# Patient Record
Sex: Male | Born: 1935 | Race: Black or African American | Hispanic: No | Marital: Married | Smoking: Former smoker
Health system: Southern US, Community
[De-identification: ages and names within clinical notes are randomized; demographics above are authoritative.]

## PROBLEM LIST (undated history)

## (undated) DIAGNOSIS — I10 Essential (primary) hypertension: Secondary | ICD-10-CM

## (undated) DIAGNOSIS — M1990 Unspecified osteoarthritis, unspecified site: Secondary | ICD-10-CM

## (undated) HISTORY — PX: COLONOSCOPY: SHX174

---

## 1999-09-02 ENCOUNTER — Ambulatory Visit (HOSPITAL_COMMUNITY): Admission: RE | Admit: 1999-09-02 | Discharge: 1999-09-02 | Payer: Self-pay | Admitting: Gastroenterology

## 1999-09-02 ENCOUNTER — Encounter (INDEPENDENT_AMBULATORY_CARE_PROVIDER_SITE_OTHER): Payer: Self-pay

## 2002-12-03 ENCOUNTER — Encounter (INDEPENDENT_AMBULATORY_CARE_PROVIDER_SITE_OTHER): Payer: Self-pay

## 2002-12-03 ENCOUNTER — Ambulatory Visit (HOSPITAL_COMMUNITY): Admission: RE | Admit: 2002-12-03 | Discharge: 2002-12-03 | Payer: Self-pay | Admitting: Gastroenterology

## 2003-04-05 ENCOUNTER — Encounter: Payer: Self-pay | Admitting: Internal Medicine

## 2003-04-05 ENCOUNTER — Ambulatory Visit (HOSPITAL_COMMUNITY): Admission: RE | Admit: 2003-04-05 | Discharge: 2003-04-05 | Payer: Self-pay | Admitting: Internal Medicine

## 2008-07-24 ENCOUNTER — Ambulatory Visit (HOSPITAL_COMMUNITY): Admission: RE | Admit: 2008-07-24 | Discharge: 2008-07-24 | Payer: Self-pay | Admitting: Internal Medicine

## 2011-02-19 NOTE — Op Note (Signed)
   [name], Derek Stephens                         ACCOUNT NO.:  [id number]   MEDICAL RECORD NO.:  [id number]                   PATIENT TYPE:  AMB   LOCATION:  ENDO                                 FACILITY:  [facility]   PHYSICIAN:  John C. Madilyn Fireman, M.D.                 DATE OF BIRTH:  1929-12-28   DATE OF PROCEDURE:  12/03/2002  DATE OF DISCHARGE:                                 OPERATIVE REPORT   PROCEDURE:  Colonoscopy with polypectomy.   INDICATION FOR PROCEDURE:  History of colon polyp on initial colonoscopy  three and a half years ago.   DESCRIPTION OF PROCEDURE:  The patient was placed in the left lateral  decubitus position and placed on the pulse monitor with continuous low-flow  oxygen delivered by nasal cannula.  He was sedated with 75 mcg of IV  fentanyl and 7 mg of IV Versed.  The Olympus video colonoscope was inserted  into the rectum and advanced to the cecum, confirmed by transillumination at  McBurney's point and visualization of the ileocecal valve and appendiceal  orifice.  The prep was excellent.  The cecum, ascending, transverse, and  descending colon all appeared normal with no masses, polyps, diverticula, or  other mucosal abnormalities.  Within the sigmoid colon, there was seen a 1-  cm polyp which was removed by snare.  The remainder of the sigmoid and  rectum appeared normal.  The scope was then withdrawn and the patient  returned to the recovery room in stable condition.  He tolerated the  procedure well and there were no immediate complications.   IMPRESSION:  A 1-cm sigmoid colon polyp.   PLAN:  Await histology and we will probably repeat colonoscopy in three to  five years.                                                John C. Madilyn Fireman, M.D.    JCH/MEDQ  D:  12/03/2002  T:  12/03/2002  Job:  [id number]   cc:   Margaretmary Bayley, M.D.  [address]  [place]  [place] [address]  Fax: [phone]

## 2011-09-09 ENCOUNTER — Ambulatory Visit (HOSPITAL_COMMUNITY)
Admission: RE | Admit: 2011-09-09 | Discharge: 2011-09-09 | Disposition: A | Discharge status: Home / Self Care | Payer: Medicare Other | Source: Ambulatory Visit | Attending: Internal Medicine | Admitting: Internal Medicine

## 2011-09-09 ENCOUNTER — Other Ambulatory Visit: Payer: Self-pay | Admitting: Internal Medicine

## 2011-09-09 DIAGNOSIS — R52 Pain, unspecified: Secondary | ICD-10-CM

## 2011-09-09 DIAGNOSIS — W11XXXA Fall on and from ladder, initial encounter: Secondary | ICD-10-CM

## 2011-09-09 DIAGNOSIS — M25559 Pain in unspecified hip: Secondary | ICD-10-CM | POA: Insufficient documentation

## 2011-09-09 DIAGNOSIS — M546 Pain in thoracic spine: Secondary | ICD-10-CM | POA: Insufficient documentation

## 2011-09-17 ENCOUNTER — Other Ambulatory Visit: Payer: Self-pay | Admitting: Internal Medicine

## 2011-09-17 DIAGNOSIS — M549 Dorsalgia, unspecified: Secondary | ICD-10-CM

## 2011-09-18 ENCOUNTER — Ambulatory Visit (HOSPITAL_COMMUNITY)
Admission: RE | Admit: 2011-09-18 | Discharge: 2011-09-18 | Disposition: A | Discharge status: Home / Self Care | Payer: MEDICARE | Source: Ambulatory Visit | Attending: Internal Medicine | Admitting: Internal Medicine

## 2011-09-18 DIAGNOSIS — M549 Dorsalgia, unspecified: Secondary | ICD-10-CM

## 2011-09-18 DIAGNOSIS — M48061 Spinal stenosis, lumbar region without neurogenic claudication: Secondary | ICD-10-CM | POA: Insufficient documentation

## 2011-09-18 DIAGNOSIS — M545 Low back pain, unspecified: Secondary | ICD-10-CM | POA: Insufficient documentation

## 2011-09-18 DIAGNOSIS — M79609 Pain in unspecified limb: Secondary | ICD-10-CM | POA: Insufficient documentation

## 2012-03-01 ENCOUNTER — Other Ambulatory Visit: Payer: Self-pay | Admitting: Neurosurgery

## 2012-03-10 ENCOUNTER — Encounter (HOSPITAL_COMMUNITY): Payer: Self-pay | Admitting: Respiratory Therapy

## 2012-03-16 ENCOUNTER — Encounter (HOSPITAL_COMMUNITY): Payer: Self-pay

## 2012-03-16 ENCOUNTER — Ambulatory Visit (HOSPITAL_COMMUNITY)
Admission: RE | Admit: 2012-03-16 | Discharge: 2012-03-16 | Disposition: A | Discharge status: Home / Self Care | Payer: Medicare Other | Source: Ambulatory Visit | Attending: Neurosurgery | Admitting: Neurosurgery

## 2012-03-16 ENCOUNTER — Encounter (HOSPITAL_COMMUNITY)
Admission: RE | Admit: 2012-03-16 | Discharge: 2012-03-16 | Disposition: A | Discharge status: Home / Self Care | Payer: Medicare Other | Source: Ambulatory Visit | Attending: Neurosurgery | Admitting: Neurosurgery

## 2012-03-16 DIAGNOSIS — M51379 Other intervertebral disc degeneration, lumbosacral region without mention of lumbar back pain or lower extremity pain: Secondary | ICD-10-CM | POA: Insufficient documentation

## 2012-03-16 DIAGNOSIS — M47817 Spondylosis without myelopathy or radiculopathy, lumbosacral region: Secondary | ICD-10-CM | POA: Insufficient documentation

## 2012-03-16 DIAGNOSIS — M5137 Other intervertebral disc degeneration, lumbosacral region: Secondary | ICD-10-CM | POA: Insufficient documentation

## 2012-03-16 DIAGNOSIS — M8448XA Pathological fracture, other site, initial encounter for fracture: Secondary | ICD-10-CM | POA: Insufficient documentation

## 2012-03-16 DIAGNOSIS — Z01812 Encounter for preprocedural laboratory examination: Secondary | ICD-10-CM | POA: Insufficient documentation

## 2012-03-16 DIAGNOSIS — Z01818 Encounter for other preprocedural examination: Secondary | ICD-10-CM | POA: Insufficient documentation

## 2012-03-16 HISTORY — DX: Unspecified osteoarthritis, unspecified site: M19.90

## 2012-03-16 HISTORY — DX: Essential (primary) hypertension: I10

## 2012-03-16 LAB — CBC
HCT: 41.1 % (ref 39.0–52.0)
Hemoglobin: 13.9 g/dL (ref 13.0–17.0)
MCH: 29.7 pg (ref 26.0–34.0)
MCHC: 33.8 g/dL (ref 30.0–36.0)
MCV: 87.8 fL (ref 78.0–100.0)
Platelets: 228 10*3/uL (ref 150–400)
RBC: 4.68 MIL/uL (ref 4.22–5.81)
RDW: 15.4 % (ref 11.5–15.5)
WBC: 4.7 10*3/uL (ref 4.0–10.5)

## 2012-03-16 LAB — BASIC METABOLIC PANEL
BUN: 18 mg/dL (ref 6–23)
CO2: 23 mEq/L (ref 19–32)
Calcium: 9.6 mg/dL (ref 8.4–10.5)
Chloride: 106 mEq/L (ref 96–112)
Creatinine, Ser: 1.42 mg/dL — ABNORMAL HIGH (ref 0.50–1.35)
GFR calc Af Amer: 53 mL/min — ABNORMAL LOW (ref >90)
GFR calc non Af Amer: 45 mL/min — ABNORMAL LOW (ref >90)
Glucose, Bld: 96 mg/dL (ref 70–99)
Potassium: 3.9 mEq/L (ref 3.5–5.1)
Sodium: 140 mEq/L (ref 135–145)

## 2012-03-16 LAB — SURGICAL PCR SCREEN
MRSA, PCR: NEGATIVE
Staphylococcus aureus: POSITIVE — AB

## 2012-03-16 NOTE — Pre-Procedure Instructions (Addendum)
20 Cid T Wierzba  03/16/2012   Your procedure is scheduled on:  03/23/2012  Report to [facility] at 8:30 AM.  Call this number if you have problems the morning of surgery: [phone]   Remember:   Do not eat food or drink After Midnight.  Wednesday        Take these medicines the morning of surgery with A SIP OF WATER:allopurinol   Do not wear jewelry, make-up or nail polish.  Do not wear lotions, powders, or perfumes. You may wear deodorant.  Do not shave 48 hours prior to surgery. Men may shave face and neck.  Do not bring valuables to the hospital.  Contacts, dentures or bridgework may not be worn into surgery.  Leave suitcase in the car. After surgery it may be brought to your room.  For patients admitted to the hospital, checkout time is 11:00 AM the day of discharge.   Patients discharged the day of surgery will not be allowed to drive home.  Name and phone number of your driver: /w family  Special Instructions: CHG Shower Use Special Wash: 1/2 bottle night before surgery and 1/2 bottle morning of surgery.   Please read over the following fact sheets that you were given: Pain Booklet, Coughing and Deep Breathing, MRSA Information and Surgical Site Infection Prevention

## 2012-03-16 NOTE — Progress Notes (Signed)
Call to Dr. Lajoyce Corners office, to request EKG, it will be faxed to [phone]

## 2012-03-22 MED ORDER — CEFAZOLIN SODIUM-DEXTROSE 2-3 GM-% IV SOLR
2.0000 g | INTRAVENOUS | Status: AC
  Administered 2012-03-23: 2 g via INTRAVENOUS
  Filled 2012-03-22: qty 50

## 2012-03-23 ENCOUNTER — Encounter (HOSPITAL_COMMUNITY): Payer: Self-pay | Admitting: *Deleted

## 2012-03-23 ENCOUNTER — Ambulatory Visit (HOSPITAL_COMMUNITY): Payer: Medicare Other | Admitting: Anesthesiology

## 2012-03-23 ENCOUNTER — Encounter (HOSPITAL_COMMUNITY): Payer: Self-pay | Admitting: Anesthesiology

## 2012-03-23 ENCOUNTER — Inpatient Hospital Stay (HOSPITAL_COMMUNITY)
Admission: RE | Admit: 2012-03-23 | Discharge: 2012-03-24 | DRG: 491 | Disposition: A | Discharge status: Home / Self Care | Payer: Medicare Other | Source: Ambulatory Visit | Attending: Neurosurgery | Admitting: Neurosurgery

## 2012-03-23 ENCOUNTER — Ambulatory Visit (HOSPITAL_COMMUNITY): Payer: Medicare Other

## 2012-03-23 ENCOUNTER — Encounter (HOSPITAL_COMMUNITY)
Admission: RE | Disposition: A | Discharge status: Home / Self Care | Payer: Self-pay | Source: Ambulatory Visit | Attending: Neurosurgery

## 2012-03-23 DIAGNOSIS — M47817 Spondylosis without myelopathy or radiculopathy, lumbosacral region: Principal | ICD-10-CM | POA: Diagnosis present

## 2012-03-23 DIAGNOSIS — R339 Retention of urine, unspecified: Secondary | ICD-10-CM | POA: Diagnosis not present

## 2012-03-23 DIAGNOSIS — Z87891 Personal history of nicotine dependence: Secondary | ICD-10-CM

## 2012-03-23 DIAGNOSIS — M51379 Other intervertebral disc degeneration, lumbosacral region without mention of lumbar back pain or lower extremity pain: Secondary | ICD-10-CM | POA: Diagnosis present

## 2012-03-23 DIAGNOSIS — M5137 Other intervertebral disc degeneration, lumbosacral region: Secondary | ICD-10-CM | POA: Diagnosis present

## 2012-03-23 DIAGNOSIS — Z79899 Other long term (current) drug therapy: Secondary | ICD-10-CM

## 2012-03-23 DIAGNOSIS — M48062 Spinal stenosis, lumbar region with neurogenic claudication: Secondary | ICD-10-CM | POA: Diagnosis present

## 2012-03-23 DIAGNOSIS — I10 Essential (primary) hypertension: Secondary | ICD-10-CM | POA: Diagnosis present

## 2012-03-23 HISTORY — PX: LUMBAR LAMINECTOMY/DECOMPRESSION MICRODISCECTOMY: SHX5026

## 2012-03-23 SURGERY — LUMBAR LAMINECTOMY/DECOMPRESSION MICRODISCECTOMY 2 LEVELS
Anesthesia: General | Site: Spine Lumbar

## 2012-03-23 MED ORDER — ALUM & MAG HYDROXIDE-SIMETH 200-200-20 MG/5ML PO SUSP: 30.0000 mL | Freq: Four times a day (QID) | ORAL | Status: DC | PRN

## 2012-03-23 MED ORDER — GLYCOPYRROLATE 0.2 MG/ML IJ SOLN
INTRAMUSCULAR | Status: DC | PRN
  Administered 2012-03-23: 0.6 mg via INTRAVENOUS

## 2012-03-23 MED ORDER — SODIUM CHLORIDE 0.9 % IJ SOLN
3.0000 mL | Freq: Two times a day (BID) | INTRAMUSCULAR | Status: DC
  Administered 2012-03-23 – 2012-03-24 (×2): 3 mL via INTRAVENOUS

## 2012-03-23 MED ORDER — ACETAMINOPHEN 325 MG PO TABS: 650.0000 mg | ORAL_TABLET | ORAL | Status: DC | PRN

## 2012-03-23 MED ORDER — MAGNESIUM HYDROXIDE 400 MG/5ML PO SUSP: 30.0000 mL | Freq: Every day | ORAL | Status: DC | PRN

## 2012-03-23 MED ORDER — NEOSTIGMINE METHYLSULFATE 1 MG/ML IJ SOLN
INTRAMUSCULAR | Status: DC | PRN
  Administered 2012-03-23: 4 mg via INTRAVENOUS

## 2012-03-23 MED ORDER — HETASTARCH-ELECTROLYTES 6 % IV SOLN
INTRAVENOUS | Status: DC | PRN
  Administered 2012-03-23: 11:00:00 via INTRAVENOUS

## 2012-03-23 MED ORDER — SODIUM CHLORIDE 0.9 % IJ SOLN: 3.0000 mL | INTRAMUSCULAR | Status: DC | PRN

## 2012-03-23 MED ORDER — LIDOCAINE-EPINEPHRINE 1 %-1:100000 IJ SOLN
INTRAMUSCULAR | Status: DC | PRN
  Administered 2012-03-23: 20 mL

## 2012-03-23 MED ORDER — AMLODIPINE-OLMESARTAN 10-40 MG PO TABS: 1.0000 | ORAL_TABLET | Freq: Every day | ORAL | Status: DC

## 2012-03-23 MED ORDER — BUPIVACAINE HCL (PF) 0.5 % IJ SOLN
INTRAMUSCULAR | Status: DC | PRN
  Administered 2012-03-23: 20 mL

## 2012-03-23 MED ORDER — LIDOCAINE HCL 1 % IJ SOLN
INTRAMUSCULAR | Status: DC | PRN
  Administered 2012-03-23: 40 mg via INTRADERMAL

## 2012-03-23 MED ORDER — MORPHINE SULFATE 4 MG/ML IJ SOLN: 4.0000 mg | INTRAMUSCULAR | Status: DC | PRN

## 2012-03-23 MED ORDER — SODIUM CHLORIDE 0.9 % IV SOLN
INTRAVENOUS | Status: AC
  Filled 2012-03-23: qty 500

## 2012-03-23 MED ORDER — PHENYLEPHRINE HCL 10 MG/ML IJ SOLN
INTRAMUSCULAR | Status: DC | PRN
  Administered 2012-03-23: 80 ug via INTRAVENOUS
  Administered 2012-03-23: 40 ug via INTRAVENOUS
  Administered 2012-03-23 (×2): 80 ug via INTRAVENOUS
  Administered 2012-03-23: 40 ug via INTRAVENOUS
  Administered 2012-03-23: 80 ug via INTRAVENOUS

## 2012-03-23 MED ORDER — OXYCODONE-ACETAMINOPHEN 5-325 MG PO TABS: 1.0000 | ORAL_TABLET | ORAL | Status: DC | PRN

## 2012-03-23 MED ORDER — LIDOCAINE HCL 4 % MT SOLN
OROMUCOSAL | Status: DC | PRN
  Administered 2012-03-23: 4 mL via TOPICAL

## 2012-03-23 MED ORDER — KETOROLAC TROMETHAMINE 30 MG/ML IJ SOLN
15.0000 mg | Freq: Four times a day (QID) | INTRAMUSCULAR | Status: DC
  Administered 2012-03-23 – 2012-03-24 (×4): 15 mg via INTRAVENOUS
  Filled 2012-03-23 (×7): qty 1

## 2012-03-23 MED ORDER — ACETAMINOPHEN 10 MG/ML IV SOLN
1000.0000 mg | Freq: Four times a day (QID) | INTRAVENOUS | Status: AC
  Administered 2012-03-23 – 2012-03-24 (×4): 1000 mg via INTRAVENOUS
  Filled 2012-03-23 (×4): qty 100

## 2012-03-23 MED ORDER — EPHEDRINE SULFATE 50 MG/ML IJ SOLN
INTRAMUSCULAR | Status: DC | PRN
  Administered 2012-03-23 (×3): 10 mg via INTRAVENOUS

## 2012-03-23 MED ORDER — THROMBIN 5000 UNITS EX KIT
PACK | CUTANEOUS | Status: DC | PRN
  Administered 2012-03-23 (×4): 5000 [IU] via TOPICAL

## 2012-03-23 MED ORDER — HEMOSTATIC AGENTS (NO CHARGE) OPTIME
TOPICAL | Status: DC | PRN
  Administered 2012-03-23 (×2): 1 via TOPICAL

## 2012-03-23 MED ORDER — FENTANYL CITRATE 0.05 MG/ML IJ SOLN
INTRAMUSCULAR | Status: DC | PRN
  Administered 2012-03-23 (×2): 100 ug via INTRAVENOUS

## 2012-03-23 MED ORDER — ALLOPURINOL 100 MG PO TABS
100.0000 mg | ORAL_TABLET | Freq: Every day | ORAL | Status: DC
  Administered 2012-03-24: 100 mg via ORAL
  Filled 2012-03-23 (×2): qty 1

## 2012-03-23 MED ORDER — 0.9 % SODIUM CHLORIDE (POUR BTL) OPTIME
TOPICAL | Status: DC | PRN
  Administered 2012-03-23: 1000 mL

## 2012-03-23 MED ORDER — ZOLPIDEM TARTRATE 5 MG PO TABS: 5.0000 mg | ORAL_TABLET | Freq: Every evening | ORAL | Status: DC | PRN

## 2012-03-23 MED ORDER — KETOROLAC TROMETHAMINE 30 MG/ML IJ SOLN: 15.0000 mg | Freq: Once | INTRAMUSCULAR | Status: DC

## 2012-03-23 MED ORDER — LACTATED RINGERS IV SOLN
INTRAVENOUS | Status: DC | PRN
  Administered 2012-03-23 (×2): via INTRAVENOUS

## 2012-03-23 MED ORDER — ACETAMINOPHEN 650 MG RE SUPP: 650.0000 mg | RECTAL | Status: DC | PRN

## 2012-03-23 MED ORDER — ONDANSETRON HCL 4 MG/2ML IJ SOLN: 4.0000 mg | Freq: Once | INTRAMUSCULAR | Status: DC | PRN

## 2012-03-23 MED ORDER — ACETAMINOPHEN 10 MG/ML IV SOLN
INTRAVENOUS | Status: AC
  Administered 2012-03-23: 1000 mg via INTRAVENOUS
  Filled 2012-03-23: qty 100

## 2012-03-23 MED ORDER — PROPOFOL 10 MG/ML IV EMUL
INTRAVENOUS | Status: DC | PRN
  Administered 2012-03-23: 150 mg via INTRAVENOUS

## 2012-03-23 MED ORDER — KCL IN DEXTROSE-NACL 20-5-0.45 MEQ/L-%-% IV SOLN
INTRAVENOUS | Status: DC
  Filled 2012-03-23 (×5): qty 1000

## 2012-03-23 MED ORDER — BISACODYL 10 MG RE SUPP: 10.0000 mg | Freq: Every day | RECTAL | Status: DC | PRN

## 2012-03-23 MED ORDER — MIDAZOLAM HCL 5 MG/5ML IJ SOLN
INTRAMUSCULAR | Status: DC | PRN
  Administered 2012-03-23: 0.5 mg via INTRAVENOUS

## 2012-03-23 MED ORDER — HYDROCODONE-ACETAMINOPHEN 5-325 MG PO TABS: 1.0000 | ORAL_TABLET | ORAL | Status: DC | PRN

## 2012-03-23 MED ORDER — ONDANSETRON HCL 4 MG/2ML IJ SOLN
INTRAMUSCULAR | Status: DC | PRN
  Administered 2012-03-23: 4 mg via INTRAVENOUS

## 2012-03-23 MED ORDER — ROCURONIUM BROMIDE 100 MG/10ML IV SOLN
INTRAVENOUS | Status: DC | PRN
  Administered 2012-03-23: 50 mg via INTRAVENOUS

## 2012-03-23 MED ORDER — CYCLOBENZAPRINE HCL 10 MG PO TABS: 10.0000 mg | ORAL_TABLET | Freq: Three times a day (TID) | ORAL | Status: DC | PRN

## 2012-03-23 MED ORDER — AMLODIPINE BESYLATE 10 MG PO TABS
10.0000 mg | ORAL_TABLET | Freq: Every day | ORAL | Status: DC
  Administered 2012-03-24: 10 mg via ORAL
  Filled 2012-03-23: qty 1

## 2012-03-23 MED ORDER — PHENOL 1.4 % MT LIQD: 1.0000 | OROMUCOSAL | Status: DC | PRN

## 2012-03-23 MED ORDER — ARTIFICIAL TEARS OP OINT
TOPICAL_OINTMENT | OPHTHALMIC | Status: DC | PRN
  Administered 2012-03-23: 1 via OPHTHALMIC

## 2012-03-23 MED ORDER — MENTHOL 3 MG MT LOZG: 1.0000 | LOZENGE | OROMUCOSAL | Status: DC | PRN

## 2012-03-23 MED ORDER — HYDROXYZINE HCL 50 MG/ML IM SOLN: 50.0000 mg | INTRAMUSCULAR | Status: DC | PRN

## 2012-03-23 MED ORDER — HYDROMORPHONE HCL PF 1 MG/ML IJ SOLN: 0.2500 mg | INTRAMUSCULAR | Status: DC | PRN

## 2012-03-23 MED ORDER — BACITRACIN 50000 UNITS IM SOLR
INTRAMUSCULAR | Status: AC
  Filled 2012-03-23: qty 1

## 2012-03-23 MED ORDER — HYDROXYZINE HCL 25 MG PO TABS: 50.0000 mg | ORAL_TABLET | ORAL | Status: DC | PRN

## 2012-03-23 MED ORDER — SODIUM CHLORIDE 0.9 % IR SOLN
Status: DC | PRN
  Administered 2012-03-23: 11:00:00

## 2012-03-23 MED ORDER — IRBESARTAN 300 MG PO TABS
300.0000 mg | ORAL_TABLET | Freq: Every day | ORAL | Status: DC
  Administered 2012-03-24: 300 mg via ORAL
  Filled 2012-03-23: qty 1

## 2012-03-23 SURGICAL SUPPLY — 58 items
BAG DECANTER FOR FLEXI CONT (MISCELLANEOUS) ×2 IMPLANT
BENZOIN TINCTURE PRP APPL 2/3 (GAUZE/BANDAGES/DRESSINGS) IMPLANT
BLADE SURG ROTATE 9660 (MISCELLANEOUS) IMPLANT
BRUSH SCRUB EZ PLAIN DRY (MISCELLANEOUS) ×2 IMPLANT
BUR ACORN 6.0 ACORN (BURR) IMPLANT
BUR ACRON 5.0MM COATED (BURR) ×2 IMPLANT
BUR MATCHSTICK NEURO 3.0 LAGG (BURR) ×2 IMPLANT
CANISTER SUCTION 2500CC (MISCELLANEOUS) ×2 IMPLANT
CLOTH BEACON ORANGE TIMEOUT ST (SAFETY) ×2 IMPLANT
CONT SPEC 4OZ CLIKSEAL STRL BL (MISCELLANEOUS) IMPLANT
DERMABOND ADVANCED (GAUZE/BANDAGES/DRESSINGS) ×3
DERMABOND ADVANCED .7 DNX12 (GAUZE/BANDAGES/DRESSINGS) ×3 IMPLANT
DRAPE LAPAROTOMY 100X72X124 (DRAPES) ×2 IMPLANT
DRAPE MICROSCOPE LEICA (MISCELLANEOUS) IMPLANT
DRAPE POUCH INSTRU U-SHP 10X18 (DRAPES) ×2 IMPLANT
DRSG EMULSION OIL 3X3 NADH (GAUZE/BANDAGES/DRESSINGS) IMPLANT
ELECT REM PT RETURN 9FT ADLT (ELECTROSURGICAL) ×2
ELECTRODE REM PT RTRN 9FT ADLT (ELECTROSURGICAL) ×1 IMPLANT
GAUZE SPONGE 4X4 16PLY XRAY LF (GAUZE/BANDAGES/DRESSINGS) ×2 IMPLANT
GLOVE BIO SURGEON STRL SZ8 (GLOVE) ×2 IMPLANT
GLOVE BIOGEL PI IND STRL 8 (GLOVE) ×1 IMPLANT
GLOVE BIOGEL PI INDICATOR 8 (GLOVE) ×1
GLOVE ECLIPSE 7.5 STRL STRAW (GLOVE) ×2 IMPLANT
GLOVE EXAM NITRILE LRG STRL (GLOVE) IMPLANT
GLOVE EXAM NITRILE MD LF STRL (GLOVE) ×2 IMPLANT
GLOVE EXAM NITRILE XL STR (GLOVE) IMPLANT
GLOVE EXAM NITRILE XS STR PU (GLOVE) IMPLANT
GLOVE INDICATOR 7.0 STRL GRN (GLOVE) ×2 IMPLANT
GLOVE SS BIOGEL STRL SZ 8 (GLOVE) ×1 IMPLANT
GLOVE SUPERSENSE BIOGEL SZ 8 (GLOVE) ×1
GLOVE SURG SS PI 7.0 STRL IVOR (GLOVE) ×4 IMPLANT
GOWN BRE IMP SLV AUR LG STRL (GOWN DISPOSABLE) ×2 IMPLANT
GOWN BRE IMP SLV AUR XL STRL (GOWN DISPOSABLE) ×4 IMPLANT
GOWN STRL REIN 2XL LVL4 (GOWN DISPOSABLE) IMPLANT
KIT BASIN OR (CUSTOM PROCEDURE TRAY) ×2 IMPLANT
KIT ROOM TURNOVER OR (KITS) ×2 IMPLANT
NEEDLE HYPO 18GX1.5 BLUNT FILL (NEEDLE) IMPLANT
NEEDLE SPNL 18GX3.5 QUINCKE PK (NEEDLE) ×2 IMPLANT
NEEDLE SPNL 22GX3.5 QUINCKE BK (NEEDLE) ×4 IMPLANT
NS IRRIG 1000ML POUR BTL (IV SOLUTION) ×2 IMPLANT
PACK LAMINECTOMY NEURO (CUSTOM PROCEDURE TRAY) ×2 IMPLANT
PAD ARMBOARD 7.5X6 YLW CONV (MISCELLANEOUS) ×6 IMPLANT
PATTIES SURGICAL .5 X1 (DISPOSABLE) ×2 IMPLANT
RUBBERBAND STERILE (MISCELLANEOUS) IMPLANT
SPONGE GAUZE 4X4 12PLY (GAUZE/BANDAGES/DRESSINGS) IMPLANT
SPONGE LAP 4X18 X RAY DECT (DISPOSABLE) IMPLANT
SPONGE SURGIFOAM ABS GEL SZ50 (HEMOSTASIS) ×2 IMPLANT
STRIP CLOSURE SKIN 1/2X4 (GAUZE/BANDAGES/DRESSINGS) IMPLANT
SUT PROLENE 6 0 BV (SUTURE) IMPLANT
SUT VIC AB 1 CT1 18XBRD ANBCTR (SUTURE) ×2 IMPLANT
SUT VIC AB 1 CT1 8-18 (SUTURE) ×2
SUT VIC AB 2-0 CP2 18 (SUTURE) ×4 IMPLANT
SUT VIC AB 3-0 SH 8-18 (SUTURE) ×2 IMPLANT
SYR 20CC LL (SYRINGE) IMPLANT
SYR 5ML LL (SYRINGE) IMPLANT
TOWEL OR 17X24 6PK STRL BLUE (TOWEL DISPOSABLE) ×2 IMPLANT
TOWEL OR 17X26 10 PK STRL BLUE (TOWEL DISPOSABLE) ×2 IMPLANT
WATER STERILE IRR 1000ML POUR (IV SOLUTION) ×2 IMPLANT

## 2012-03-23 NOTE — Transfer of Care (Signed)
Immediate Anesthesia Transfer of Care Note  Patient: Derek Stephens  Procedure(s) Performed: Procedure(s) (LRB): LUMBAR LAMINECTOMY/DECOMPRESSION MICRODISCECTOMY 2 LEVELS (N/A)  Patient Location: PACU  Anesthesia Type: General  Level of Consciousness: awake, alert , oriented and patient cooperative  Airway & Oxygen Therapy: Patient Spontanous Breathing and Patient connected to nasal cannula oxygen  Post-op Assessment: Report given to PACU RN, Post -op Vital signs reviewed and stable and Patient moving all extremities X 4  Post vital signs: Reviewed and stable  Complications: No apparent anesthesia complications

## 2012-03-23 NOTE — H&P (Signed)
Subjective: Patient is a 78 y.o. male who is admitted for treatment of multilevel, multifactorial lumbar stenosis. Patient is admitted for an L2-L4 decompressive lumbar laminectomy. Symptomatically he has been having difficulties with neurogenic claudication. He has been treated with prednisone, chiropractic treatment including chiropractic spinal decompression, Celebrex, and a series of 3 epidural steroid injections. Unfortunately he's had little if any relief from any of these measures and treatments.    Past Medical History  Diagnosis Date  . Hypertension     PCP - does ekg. annually - last done 08/2011  . Arthritis     lumbar degeneration     Past Surgical History  Procedure Date  . Colonoscopy     2009- wnl     Prescriptions prior to admission  Medication Sig Dispense Refill  . allopurinol (ZYLOPRIM) 100 MG tablet Take 100 mg by mouth daily with breakfast.       . amLODipine-olmesartan (AZOR) 10-40 MG per tablet Take 1 tablet by mouth daily with breakfast.       . celecoxib (CELEBREX) 200 MG capsule Take 200 mg by mouth daily.      . naproxen sodium (ANAPROX) 220 MG tablet Take 220 mg by mouth daily with breakfast.       No Known Allergies  History  Substance Use Topics  . Smoking status: Former Smoker    Quit date: 03/17/1987  . Smokeless tobacco: Not on file  . Alcohol Use: No    History reviewed. No pertinent family history.   Review of Systems A comprehensive review of systems was negative.  Objective: Vital signs in last 24 hours: Temp:  [97.8 F (36.6 C)] 97.8 F (36.6 C) (06/20 0823) Pulse Rate:  [77] 77  (06/20 0823) Resp:  [18] 18  (06/20 0823) BP: (155)/(86) 155/86 mmHg (06/20 0823) SpO2:  [98 %] 98 % (06/20 0823)  EXAM: Patient is a well-developed well-nourished male in no acute distress. Lungs are clear to auscultation , the patient has symmetrical respiratory excursion. Heart has a regular rate and rhythm normal S1 and S2 no murmur.   Abdomen is soft  nontender nondistended bowel sounds are present. Extremity examination shows no clubbing cyanosis or edema. Musculoskeletal examination shows mild tenderness to palpation in the lower lumbar region. He is able to flex to 90, he is able to extend to only about 5. Straight leg raising is negative bilaterally. Motor examination shows 5 over 5 strength in the lower extremities including the iliopsoas quadriceps dorsiflexor extensor hallicus  longus and plantar flexor bilaterally. Sensation is intact to pinprick in the distal lower extremities. Reflexes are symmetrical bilaterally. No pathologic reflexes are present. Patient has a normal gait and stance.   Data Review:CBC    Component Value Date/Time   WBC 4.7 03/16/2012 1344   RBC 4.68 03/16/2012 1344   HGB 13.9 03/16/2012 1344   HCT 41.1 03/16/2012 1344   PLT 228 03/16/2012 1344   MCV 87.8 03/16/2012 1344   MCH 29.7 03/16/2012 1344   MCHC 33.8 03/16/2012 1344   RDW 15.4 03/16/2012 1344                          BMET    Component Value Date/Time   NA 140 03/16/2012 1344   K 3.9 03/16/2012 1344   CL 106 03/16/2012 1344   CO2 23 03/16/2012 1344   GLUCOSE 96 03/16/2012 1344   BUN 18 03/16/2012 1344   CREATININE 1.42* 03/16/2012 1344  CALCIUM 9.6 03/16/2012 1344   GFRNONAA 45* 03/16/2012 1344   GFRAA 53* 03/16/2012 1344     Assessment/Plan: Patient with multilevel multifactorial lumbar stenosis, who has not responded to extensive nonsurgical treatment. He is admitted now for decompressive lumbar laminectomy. I've discussed with the patient the nature of his condition, the nature the surgical procedure, the typical length of surgery, hospital stay, and overall recuperation. We discussed limitations postoperatively. I discussed risks of surgery including risks of infection, bleeding, possibly need for transfusion, the risk of nerve root dysfunction with pain, weakness, numbness, or paresthesias, or risk of dural tear and CSF leakage and possible need for  further surgery, and the risk of anesthetic complications including myocardial infarction, stroke, pneumonia, and death. Understanding all this the patient does wish to proceed with surgery and is admitted for such.    Hewitt Shorts, MD 03/23/2012 9:40 AM

## 2012-03-23 NOTE — Progress Notes (Signed)
Filed Vitals:   03/23/12 1357 03/23/12 1400 03/23/12 1407 03/23/12 1412  BP: 127/61   127/60  Pulse:  65 68   Temp:  97.2 F (36.2 C)    TempSrc:      Resp:  15 18   SpO2:  98% 96%      Patient sitting up inside of bed. He has voided a small amount and the nursing staff is going to closely monitor his voiding function. He notes that his legs feel much better when he standing and moving about, as compared to prior surgery. He had a small amount of bloody drainage from his wound and the nursing staff applied a dry dressing.  Plan: Continued to progress through postoperative recovery. I've encouraged patient to ambulate several times this evening in the halls.  Hewitt Shorts, MD 03/23/2012, 2:41 PM

## 2012-03-23 NOTE — Anesthesia Preprocedure Evaluation (Addendum)
Anesthesia Evaluation  Patient identified by MRN, date of birth, ID band Patient awake    Reviewed: Allergy & Precautions, H&P , NPO status , Patient's Chart, lab work & pertinent test results  History of Anesthesia Complications Negative for: history of anesthetic complications  Airway Mallampati: II TM Distance: >3 FB Neck ROM: Full    Dental  (+) Teeth Intact and Dental Advisory Given   Pulmonary former smoker breath sounds clear to auscultation        Cardiovascular hypertension, Pt. on medications Rhythm:Regular Rate:Normal     Neuro/Psych Pain with BLE negative neurological ROS  negative psych ROS   GI/Hepatic negative GI ROS, Neg liver ROS,   Endo/Other  negative endocrine ROSGout  Renal/GU Renal InsufficiencyRenal disease (Cr 1.42)     Musculoskeletal  (+) Arthritis -, Osteoarthritis,    Abdominal (+) + obese,   Peds  Hematology negative hematology ROS (+)   Anesthesia Other Findings   Reproductive/Obstetrics                        Anesthesia Physical Anesthesia Plan  ASA: II  Anesthesia Plan: General   Post-op Pain Management:    Induction: Intravenous  Airway Management Planned: Oral ETT  Additional Equipment:   Intra-op Plan:   Post-operative Plan: Extubation in OR  Informed Consent: I have reviewed the patients History and Physical, chart, labs and discussed the procedure including the risks, benefits and alternatives for the proposed anesthesia with the patient or authorized representative who has indicated his/her understanding and acceptance.   Dental advisory given  Plan Discussed with: CRNA and Surgeon  Anesthesia Plan Comments: (Htn Lumbar spinal stenosis Gout  Plan GA   Kipp Brood, MD)        Anesthesia Quick Evaluation

## 2012-03-23 NOTE — Plan of Care (Signed)
Problem: Consults Goal: Diagnosis - Spinal Surgery Outcome: Completed/Met Date Met:  03/23/12 Lumbar Laminectomy (Complex)     

## 2012-03-23 NOTE — Preoperative (Signed)
Beta Blockers   Reason not to administer Beta Blockers:Not Applicable 

## 2012-03-23 NOTE — Anesthesia Procedure Notes (Signed)
Procedure Name: Intubation Date/Time: 03/23/2012 10:17 AM Performed by: Leona Singleton A Pre-anesthesia Checklist: Patient identified Patient Re-evaluated:Patient Re-evaluated prior to inductionOxygen Delivery Method: Circle system utilized Preoxygenation: Pre-oxygenation with 100% oxygen Intubation Type: IV induction Ventilation: Mask ventilation without difficulty and Oral airway inserted - appropriate to patient size Laryngoscope Size: Hyacinth Meeker and 2 Grade View: Grade II Tube type: Oral Tube size: 7.5 mm Number of attempts: 1 Airway Equipment and Method: Stylet and LTA kit utilized Placement Confirmation: ETT inserted through vocal cords under direct vision,  positive ETCO2 and breath sounds checked- equal and bilateral Secured at: 22 cm Tube secured with: Tape Dental Injury: Teeth and Oropharynx as per pre-operative assessment

## 2012-03-23 NOTE — Anesthesia Postprocedure Evaluation (Signed)
  Anesthesia Post-op Note  Patient: Derek Stephens  Procedure(s) Performed: Procedure(s) (LRB): LUMBAR LAMINECTOMY/DECOMPRESSION MICRODISCECTOMY 2 LEVELS (N/A)  Patient Location: PACU  Anesthesia Type: General  Level of Consciousness: awake, alert  and oriented  Airway and Oxygen Therapy: Patient Spontanous Breathing and Patient connected to nasal cannula oxygen  Post-op Pain: mild  Post-op Assessment: Post-op Vital signs reviewed and Patient's Cardiovascular Status Stable  Post-op Vital Signs: stable  Complications: No apparent anesthesia complications

## 2012-03-23 NOTE — Op Note (Signed)
03/23/2012  12:48 PM  PATIENT:  Derek Stephens Kettle  78 y.o. male  PRE-OPERATIVE DIAGNOSIS:  Lumbar stenosis, Lumbar spondylosis, Lumbar degenerative disc disease, Lumbar radiculopathy  POST-OPERATIVE DIAGNOSIS:  Lumbar Stenosis, Lumbar Spondylosis, Lumbar Degenerative Disc Disease, Lumbar Radiculopathy  PROCEDURE:  Procedure(s): LUMBAR LAMINECTOMY/DECOMPRESSION MICRODISCECTOMY 3 LEVELS: L2-L4 decompressive lumbar laminectomy with decompression of the exiting L2, L3, and L4 nerve roots bilaterally with microdissection microsurgical technique  SURGEON:  Surgeon(s): Hewitt Shorts, MD Cristi Loron, MD  ASSISTANTS: Tressie Stalker, M.D.  ANESTHESIA:   general  EBL:  Total I/O In: 1500 [I.V.:1000; IV Piggyback:500] Out: 125 [Blood:125]  COUNT: Correct per nursing staff  DICTATION: Patient was brought to the operating room placed under general endotracheal anesthesia. Patient was turned to a prone position the lumbar region was prepped with Betadine soap and solution and draped in a sterile fashion. The midline was infiltrated with local anesthetic with epinephrine. A localizing x-ray was taken and then a midline incision was made carried down thru the subcutaneous tissue, bipolar cautery and electrocautery were used to maintain hemostasis. Dissection was carried down to the lumbar fascia which was incised bilaterally and the paraspinal muscles were dissected from the spinous process and lamina in a subperiosteal fashion. Another localizing x-ray was taken and the L2, L3, and L4 levels were identified. Laminectomy was begun with double-action rongeurs a high-speed drill and Kerrison punches. The thickened ligamentum flavum was carefully removed. Dissection was carried laterally to decompress the lateral stenosis taking care to leave the facet complexes intact. Decompression was carried out laterally, removing spondylitic overgrowth and thickened ligamentum flavum compressing the exiting L2,  L3, and L4 nerve roots as they exited into their corresponding neural foramina. Once the decompression was completed hemostasis was established with the use of bipolar cautery and Gelfoam with thrombin. Paraspinal muscles deep fascia and Scarpa's fascia were closed in separate layers with interrupted 1 undyed Vicryl sutures. The subcutaneous and subcuticular were closed with interrupted inverted 2-0 undyed Vicryl sutures. Skin edges were approximated with Dermabond.   PLAN OF CARE: Admit to inpatient   PATIENT DISPOSITION:  PACU - hemodynamically stable.   Delay start of Pharmacological VTE agent (>24hrs) due to surgical blood loss or risk of bleeding:  yes

## 2012-03-24 ENCOUNTER — Encounter (HOSPITAL_COMMUNITY): Payer: Self-pay | Admitting: Neurosurgery

## 2012-03-24 MED ORDER — TAMSULOSIN HCL 0.4 MG PO CAPS: 0.4000 mg | ORAL_CAPSULE | Freq: Every day | ORAL | Status: DC

## 2012-03-24 MED ORDER — HYDROCODONE-ACETAMINOPHEN 5-325 MG PO TABS: 1.0000 | ORAL_TABLET | ORAL | Status: AC | PRN

## 2012-03-24 MED ORDER — TAMSULOSIN HCL 0.4 MG PO CAPS
0.8000 mg | ORAL_CAPSULE | Freq: Once | ORAL | Status: AC
  Administered 2012-03-24: 0.8 mg via ORAL
  Filled 2012-03-24: qty 2

## 2012-03-24 NOTE — Discharge Summary (Signed)
Physician Discharge Summary  Patient ID: Derek Stephens MRN: [id number] DOB/AGE: [birth year] 78 y.o.  Admit date: 03/23/2012 Discharge date: 03/24/2012  Admission Diagnoses: Lumbar stenosis, lumbar spondylosis, lumbar degenerative disc disease, neurogenic claudication  Discharge Diagnoses: Lumbar stenosis, lumbar spondylosis, lumbar degenerative disc disease, neurogenic claudication  Discharged Condition: good  Hospital Course: Patient was admitted and underwent an L2-L4 decompressive lumbar laminectomy. He has had excellent relief of his neurogenic claudication. His wound is healing well, it is clean and dry. He is up and ambulating in the halls. He has had mild urinary retention postoperatively. He has required a couple of in and out catheterizations. We have started him on Flomax. He feels that he may have had some mild urinary retention prior to admission, he notes that he has benign prostatic hypertrophy, followed by Dr. Ihor Gully, whom he is scheduled to see in followup next month. We are discharging to home with a prescription for one month of Flomax, with the intention that he and Dr. Vernie Ammons will decide whether to continue the Flomax subsequently. He and his wife have been given instructions regarding wound care and activities. He is to return to my office for followup in 3 weeks.  Discharge Exam: Blood pressure 152/64, pulse 76, temperature 97.4 F (36.3 C), temperature source Oral, resp. rate 18, SpO2 97.00%.  Disposition: Home   Medication List  As of 03/24/2012  6:05 PM   TAKE these medications         allopurinol 100 MG tablet   Commonly known as: ZYLOPRIM   Take 100 mg by mouth daily with breakfast.      AZOR 10-40 MG per tablet   Generic drug: amLODipine-olmesartan   Take 1 tablet by mouth daily with breakfast.      celecoxib 200 MG capsule   Commonly known as: CELEBREX   Take 200 mg by mouth daily.      HYDROcodone-acetaminophen 5-325 MG per tablet   Commonly  known as: NORCO   Take 1-2 tablets by mouth every 4 (four) hours as needed.      naproxen sodium 220 MG tablet   Commonly known as: ANAPROX   Take 220 mg by mouth daily with breakfast.      Tamsulosin HCl 0.4 MG Caps   Commonly known as: FLOMAX   Take 1 capsule (0.4 mg total) by mouth daily.             Signed: Hewitt Shorts, MD 03/24/2012, 6:05 PM

## 2012-03-24 NOTE — Progress Notes (Signed)
Filed Vitals:   03/23/12 1630 03/23/12 2000 03/23/12 2355 03/24/12 0400  BP: 135/78 131/74 154/78 125/74  Pulse: 71 68 76 76  Temp: 97.5 F (36.4 C) 97.5 F (36.4 C) 97.9 F (36.6 C) 98 F (36.7 C)  TempSrc:   Oral Oral  Resp: 20 16 16 18   SpO2: 99% 95% 95% 93%    Patient sitting up out of bed. Comfortable. Dressing removed, wound clean and dry. He has been up and living in the halls. However he's been having moderate post void residuals. He has declined a it out catheterization, although he did have one at about midnight last night. The nursing staff is going to monitor his voiding function, and post void residuals over the day and will contact me later this afternoon to update me regarding his bladder function. In the meantime I've encouraged him to be and living in the halls actively.  Plan: We'll continue to to progress to postoperative recovery.  Hewitt Shorts, MD 03/24/2012, 8:44 AM

## 2012-03-24 NOTE — Discharge Instructions (Signed)

## 2012-03-24 NOTE — Progress Notes (Signed)
UR COMPLETED  

## 2012-08-30 ENCOUNTER — Encounter (INDEPENDENT_AMBULATORY_CARE_PROVIDER_SITE_OTHER): Payer: Medicare Other | Admitting: Ophthalmology

## 2012-08-30 DIAGNOSIS — I10 Essential (primary) hypertension: Secondary | ICD-10-CM

## 2012-08-30 DIAGNOSIS — H35039 Hypertensive retinopathy, unspecified eye: Secondary | ICD-10-CM

## 2012-08-30 DIAGNOSIS — H2510 Age-related nuclear cataract, unspecified eye: Secondary | ICD-10-CM

## 2012-08-30 DIAGNOSIS — H33309 Unspecified retinal break, unspecified eye: Secondary | ICD-10-CM

## 2012-08-30 DIAGNOSIS — H43819 Vitreous degeneration, unspecified eye: Secondary | ICD-10-CM

## 2012-09-13 ENCOUNTER — Ambulatory Visit (INDEPENDENT_AMBULATORY_CARE_PROVIDER_SITE_OTHER): Payer: Medicare Other | Admitting: Ophthalmology

## 2012-09-13 DIAGNOSIS — H43819 Vitreous degeneration, unspecified eye: Secondary | ICD-10-CM

## 2012-09-13 DIAGNOSIS — I10 Essential (primary) hypertension: Secondary | ICD-10-CM

## 2012-09-13 DIAGNOSIS — H33309 Unspecified retinal break, unspecified eye: Secondary | ICD-10-CM

## 2012-09-13 DIAGNOSIS — H35039 Hypertensive retinopathy, unspecified eye: Secondary | ICD-10-CM

## 2013-01-12 ENCOUNTER — Ambulatory Visit (INDEPENDENT_AMBULATORY_CARE_PROVIDER_SITE_OTHER): Payer: Medicare Other | Admitting: Ophthalmology

## 2013-01-12 DIAGNOSIS — H33309 Unspecified retinal break, unspecified eye: Secondary | ICD-10-CM

## 2013-01-12 DIAGNOSIS — H35039 Hypertensive retinopathy, unspecified eye: Secondary | ICD-10-CM

## 2013-01-12 DIAGNOSIS — H2510 Age-related nuclear cataract, unspecified eye: Secondary | ICD-10-CM

## 2013-01-12 DIAGNOSIS — H43819 Vitreous degeneration, unspecified eye: Secondary | ICD-10-CM

## 2013-01-12 DIAGNOSIS — I10 Essential (primary) hypertension: Secondary | ICD-10-CM

## 2013-07-05 ENCOUNTER — Other Ambulatory Visit: Payer: Self-pay

## 2015-12-18 DIAGNOSIS — M109 Gout, unspecified: Secondary | ICD-10-CM | POA: Diagnosis not present

## 2015-12-18 DIAGNOSIS — E559 Vitamin D deficiency, unspecified: Secondary | ICD-10-CM | POA: Diagnosis not present

## 2015-12-18 DIAGNOSIS — I10 Essential (primary) hypertension: Secondary | ICD-10-CM | POA: Diagnosis not present

## 2016-01-08 DIAGNOSIS — H40013 Open angle with borderline findings, low risk, bilateral: Secondary | ICD-10-CM | POA: Diagnosis not present

## 2016-01-08 DIAGNOSIS — H04129 Dry eye syndrome of unspecified lacrimal gland: Secondary | ICD-10-CM | POA: Diagnosis not present

## 2016-01-08 DIAGNOSIS — H11153 Pinguecula, bilateral: Secondary | ICD-10-CM | POA: Diagnosis not present

## 2016-01-08 DIAGNOSIS — H524 Presbyopia: Secondary | ICD-10-CM | POA: Diagnosis not present

## 2016-02-10 DIAGNOSIS — R972 Elevated prostate specific antigen [PSA]: Secondary | ICD-10-CM | POA: Diagnosis not present

## 2016-02-17 DIAGNOSIS — N401 Enlarged prostate with lower urinary tract symptoms: Secondary | ICD-10-CM | POA: Diagnosis not present

## 2016-02-17 DIAGNOSIS — Z0000 Encounter for general adult medical examination without abnormal findings: Secondary | ICD-10-CM | POA: Diagnosis not present

## 2016-02-17 DIAGNOSIS — R351 Nocturia: Secondary | ICD-10-CM | POA: Diagnosis not present

## 2016-02-17 DIAGNOSIS — R972 Elevated prostate specific antigen [PSA]: Secondary | ICD-10-CM | POA: Diagnosis not present

## 2016-04-07 DIAGNOSIS — H00024 Hordeolum internum left upper eyelid: Secondary | ICD-10-CM | POA: Diagnosis not present

## 2016-06-16 DIAGNOSIS — M109 Gout, unspecified: Secondary | ICD-10-CM | POA: Diagnosis not present

## 2016-06-16 DIAGNOSIS — E559 Vitamin D deficiency, unspecified: Secondary | ICD-10-CM | POA: Diagnosis not present

## 2016-06-16 DIAGNOSIS — Z23 Encounter for immunization: Secondary | ICD-10-CM | POA: Diagnosis not present

## 2016-06-16 DIAGNOSIS — I10 Essential (primary) hypertension: Secondary | ICD-10-CM | POA: Diagnosis not present

## 2016-06-16 DIAGNOSIS — E790 Hyperuricemia without signs of inflammatory arthritis and tophaceous disease: Secondary | ICD-10-CM | POA: Diagnosis not present

## 2016-07-15 DIAGNOSIS — H2513 Age-related nuclear cataract, bilateral: Secondary | ICD-10-CM | POA: Diagnosis not present

## 2016-07-15 DIAGNOSIS — H40013 Open angle with borderline findings, low risk, bilateral: Secondary | ICD-10-CM | POA: Diagnosis not present

## 2016-07-15 DIAGNOSIS — H35033 Hypertensive retinopathy, bilateral: Secondary | ICD-10-CM | POA: Diagnosis not present

## 2016-07-15 DIAGNOSIS — H25013 Cortical age-related cataract, bilateral: Secondary | ICD-10-CM | POA: Diagnosis not present

## 2016-08-10 DIAGNOSIS — R972 Elevated prostate specific antigen [PSA]: Secondary | ICD-10-CM | POA: Diagnosis not present

## 2016-08-17 DIAGNOSIS — R972 Elevated prostate specific antigen [PSA]: Secondary | ICD-10-CM | POA: Diagnosis not present

## 2016-08-17 DIAGNOSIS — N401 Enlarged prostate with lower urinary tract symptoms: Secondary | ICD-10-CM | POA: Diagnosis not present

## 2016-08-17 DIAGNOSIS — R351 Nocturia: Secondary | ICD-10-CM | POA: Diagnosis not present

## 2016-12-16 DIAGNOSIS — Z125 Encounter for screening for malignant neoplasm of prostate: Secondary | ICD-10-CM | POA: Diagnosis not present

## 2016-12-16 DIAGNOSIS — I10 Essential (primary) hypertension: Secondary | ICD-10-CM | POA: Diagnosis not present

## 2017-02-09 DIAGNOSIS — R972 Elevated prostate specific antigen [PSA]: Secondary | ICD-10-CM | POA: Diagnosis not present

## 2017-02-16 DIAGNOSIS — N403 Nodular prostate with lower urinary tract symptoms: Secondary | ICD-10-CM | POA: Diagnosis not present

## 2017-02-16 DIAGNOSIS — R972 Elevated prostate specific antigen [PSA]: Secondary | ICD-10-CM | POA: Diagnosis not present

## 2017-02-16 DIAGNOSIS — R351 Nocturia: Secondary | ICD-10-CM | POA: Diagnosis not present

## 2017-03-04 DIAGNOSIS — R195 Other fecal abnormalities: Secondary | ICD-10-CM | POA: Diagnosis not present

## 2017-04-04 DIAGNOSIS — R195 Other fecal abnormalities: Secondary | ICD-10-CM | POA: Diagnosis not present

## 2017-04-11 DIAGNOSIS — R195 Other fecal abnormalities: Secondary | ICD-10-CM | POA: Diagnosis not present

## 2017-07-13 DIAGNOSIS — Z0000 Encounter for general adult medical examination without abnormal findings: Secondary | ICD-10-CM | POA: Diagnosis not present

## 2017-07-13 DIAGNOSIS — I10 Essential (primary) hypertension: Secondary | ICD-10-CM | POA: Diagnosis not present

## 2017-07-13 DIAGNOSIS — Z125 Encounter for screening for malignant neoplasm of prostate: Secondary | ICD-10-CM | POA: Diagnosis not present

## 2017-07-13 DIAGNOSIS — R7302 Impaired glucose tolerance (oral): Secondary | ICD-10-CM | POA: Diagnosis not present

## 2017-07-13 DIAGNOSIS — M2550 Pain in unspecified joint: Secondary | ICD-10-CM | POA: Diagnosis not present

## 2017-07-25 DIAGNOSIS — H25013 Cortical age-related cataract, bilateral: Secondary | ICD-10-CM | POA: Diagnosis not present

## 2017-07-25 DIAGNOSIS — H35033 Hypertensive retinopathy, bilateral: Secondary | ICD-10-CM | POA: Diagnosis not present

## 2017-07-25 DIAGNOSIS — H40013 Open angle with borderline findings, low risk, bilateral: Secondary | ICD-10-CM | POA: Diagnosis not present

## 2017-07-25 DIAGNOSIS — H2513 Age-related nuclear cataract, bilateral: Secondary | ICD-10-CM | POA: Diagnosis not present

## 2017-08-15 DIAGNOSIS — R972 Elevated prostate specific antigen [PSA]: Secondary | ICD-10-CM | POA: Diagnosis not present

## 2017-08-22 ENCOUNTER — Other Ambulatory Visit: Payer: Self-pay | Admitting: Urology

## 2017-08-22 DIAGNOSIS — R351 Nocturia: Secondary | ICD-10-CM | POA: Diagnosis not present

## 2017-08-22 DIAGNOSIS — R972 Elevated prostate specific antigen [PSA]: Secondary | ICD-10-CM

## 2017-08-22 DIAGNOSIS — N403 Nodular prostate with lower urinary tract symptoms: Secondary | ICD-10-CM | POA: Diagnosis not present

## 2017-09-07 ENCOUNTER — Ambulatory Visit
Admission: RE | Admit: 2017-09-07 | Discharge: 2017-09-07 | Disposition: A | Discharge status: Home / Self Care | Payer: PPO | Source: Ambulatory Visit | Attending: Urology | Admitting: Urology

## 2017-09-07 DIAGNOSIS — R972 Elevated prostate specific antigen [PSA]: Secondary | ICD-10-CM

## 2017-09-07 MED ORDER — GADOBENATE DIMEGLUMINE 529 MG/ML IV SOLN: 18.0000 mL | Freq: Once | INTRAVENOUS | Status: DC | PRN

## 2017-09-19 DIAGNOSIS — R972 Elevated prostate specific antigen [PSA]: Secondary | ICD-10-CM | POA: Diagnosis not present

## 2017-09-19 DIAGNOSIS — N419 Inflammatory disease of prostate, unspecified: Secondary | ICD-10-CM | POA: Diagnosis not present

## 2017-09-23 DIAGNOSIS — R972 Elevated prostate specific antigen [PSA]: Secondary | ICD-10-CM | POA: Diagnosis not present

## 2017-11-08 DIAGNOSIS — R972 Elevated prostate specific antigen [PSA]: Secondary | ICD-10-CM | POA: Diagnosis not present

## 2017-11-08 DIAGNOSIS — I10 Essential (primary) hypertension: Secondary | ICD-10-CM | POA: Diagnosis not present

## 2017-11-08 DIAGNOSIS — N183 Chronic kidney disease, stage 3 (moderate): Secondary | ICD-10-CM | POA: Diagnosis not present

## 2018-01-17 DIAGNOSIS — H40013 Open angle with borderline findings, low risk, bilateral: Secondary | ICD-10-CM | POA: Diagnosis not present

## 2018-03-02 DIAGNOSIS — I10 Essential (primary) hypertension: Secondary | ICD-10-CM | POA: Diagnosis not present

## 2018-03-02 DIAGNOSIS — N183 Chronic kidney disease, stage 3 (moderate): Secondary | ICD-10-CM | POA: Diagnosis not present

## 2018-03-20 DIAGNOSIS — R972 Elevated prostate specific antigen [PSA]: Secondary | ICD-10-CM | POA: Diagnosis not present

## 2018-03-29 DIAGNOSIS — N403 Nodular prostate with lower urinary tract symptoms: Secondary | ICD-10-CM | POA: Diagnosis not present

## 2018-03-29 DIAGNOSIS — R972 Elevated prostate specific antigen [PSA]: Secondary | ICD-10-CM | POA: Diagnosis not present

## 2018-05-09 DIAGNOSIS — R972 Elevated prostate specific antigen [PSA]: Secondary | ICD-10-CM | POA: Diagnosis not present

## 2018-05-09 DIAGNOSIS — N183 Chronic kidney disease, stage 3 (moderate): Secondary | ICD-10-CM | POA: Diagnosis not present

## 2018-05-09 DIAGNOSIS — M109 Gout, unspecified: Secondary | ICD-10-CM | POA: Diagnosis not present

## 2018-05-09 DIAGNOSIS — I10 Essential (primary) hypertension: Secondary | ICD-10-CM | POA: Diagnosis not present

## 2018-05-30 ENCOUNTER — Encounter (HOSPITAL_COMMUNITY): Payer: Self-pay

## 2018-05-30 ENCOUNTER — Observation Stay (HOSPITAL_COMMUNITY)
Admission: EM | Admit: 2018-05-30 | Discharge: 2018-05-31 | Disposition: A | Discharge status: Home / Self Care | Payer: PPO | Attending: Internal Medicine | Admitting: Internal Medicine

## 2018-05-30 ENCOUNTER — Other Ambulatory Visit: Payer: Self-pay

## 2018-05-30 ENCOUNTER — Observation Stay (HOSPITAL_COMMUNITY): Payer: PPO | Admitting: Anesthesiology

## 2018-05-30 ENCOUNTER — Encounter (HOSPITAL_COMMUNITY)
Admission: EM | Disposition: A | Discharge status: Home / Self Care | Payer: Self-pay | Source: Home / Self Care | Attending: Emergency Medicine

## 2018-05-30 ENCOUNTER — Observation Stay (HOSPITAL_COMMUNITY): Payer: PPO

## 2018-05-30 DIAGNOSIS — Z79899 Other long term (current) drug therapy: Secondary | ICD-10-CM | POA: Diagnosis not present

## 2018-05-30 DIAGNOSIS — R55 Syncope and collapse: Secondary | ICD-10-CM | POA: Diagnosis not present

## 2018-05-30 DIAGNOSIS — R05 Cough: Secondary | ICD-10-CM | POA: Diagnosis not present

## 2018-05-30 DIAGNOSIS — K2951 Unspecified chronic gastritis with bleeding: Secondary | ICD-10-CM | POA: Diagnosis not present

## 2018-05-30 DIAGNOSIS — K2950 Unspecified chronic gastritis without bleeding: Secondary | ICD-10-CM | POA: Diagnosis not present

## 2018-05-30 DIAGNOSIS — I951 Orthostatic hypotension: Secondary | ICD-10-CM | POA: Diagnosis not present

## 2018-05-30 DIAGNOSIS — B9681 Helicobacter pylori [H. pylori] as the cause of diseases classified elsewhere: Secondary | ICD-10-CM | POA: Insufficient documentation

## 2018-05-30 DIAGNOSIS — Z8739 Personal history of other diseases of the musculoskeletal system and connective tissue: Secondary | ICD-10-CM | POA: Diagnosis not present

## 2018-05-30 DIAGNOSIS — K922 Gastrointestinal hemorrhage, unspecified: Secondary | ICD-10-CM

## 2018-05-30 DIAGNOSIS — K921 Melena: Secondary | ICD-10-CM | POA: Diagnosis not present

## 2018-05-30 DIAGNOSIS — R059 Cough, unspecified: Secondary | ICD-10-CM

## 2018-05-30 DIAGNOSIS — D62 Acute posthemorrhagic anemia: Secondary | ICD-10-CM | POA: Diagnosis present

## 2018-05-30 DIAGNOSIS — R2689 Other abnormalities of gait and mobility: Secondary | ICD-10-CM | POA: Diagnosis not present

## 2018-05-30 DIAGNOSIS — K2980 Duodenitis without bleeding: Secondary | ICD-10-CM | POA: Diagnosis not present

## 2018-05-30 DIAGNOSIS — I10 Essential (primary) hypertension: Secondary | ICD-10-CM | POA: Diagnosis present

## 2018-05-30 DIAGNOSIS — K222 Esophageal obstruction: Principal | ICD-10-CM | POA: Insufficient documentation

## 2018-05-30 DIAGNOSIS — M109 Gout, unspecified: Secondary | ICD-10-CM | POA: Diagnosis not present

## 2018-05-30 DIAGNOSIS — Z7982 Long term (current) use of aspirin: Secondary | ICD-10-CM | POA: Diagnosis not present

## 2018-05-30 DIAGNOSIS — I129 Hypertensive chronic kidney disease with stage 1 through stage 4 chronic kidney disease, or unspecified chronic kidney disease: Secondary | ICD-10-CM | POA: Insufficient documentation

## 2018-05-30 DIAGNOSIS — N400 Enlarged prostate without lower urinary tract symptoms: Secondary | ICD-10-CM | POA: Insufficient documentation

## 2018-05-30 DIAGNOSIS — K920 Hematemesis: Secondary | ICD-10-CM | POA: Insufficient documentation

## 2018-05-30 DIAGNOSIS — R4020 Unspecified coma: Secondary | ICD-10-CM | POA: Diagnosis not present

## 2018-05-30 DIAGNOSIS — R112 Nausea with vomiting, unspecified: Secondary | ICD-10-CM | POA: Diagnosis not present

## 2018-05-30 DIAGNOSIS — R404 Transient alteration of awareness: Secondary | ICD-10-CM | POA: Diagnosis not present

## 2018-05-30 DIAGNOSIS — K269 Duodenal ulcer, unspecified as acute or chronic, without hemorrhage or perforation: Secondary | ICD-10-CM | POA: Diagnosis not present

## 2018-05-30 DIAGNOSIS — K259 Gastric ulcer, unspecified as acute or chronic, without hemorrhage or perforation: Secondary | ICD-10-CM | POA: Diagnosis not present

## 2018-05-30 DIAGNOSIS — K2981 Duodenitis with bleeding: Secondary | ICD-10-CM | POA: Diagnosis not present

## 2018-05-30 DIAGNOSIS — R58 Hemorrhage, not elsewhere classified: Secondary | ICD-10-CM | POA: Diagnosis not present

## 2018-05-30 DIAGNOSIS — N183 Chronic kidney disease, stage 3 (moderate): Secondary | ICD-10-CM | POA: Diagnosis not present

## 2018-05-30 HISTORY — PX: BIOPSY: SHX5522

## 2018-05-30 HISTORY — PX: ESOPHAGOGASTRODUODENOSCOPY (EGD) WITH PROPOFOL: SHX5813

## 2018-05-30 LAB — PROTIME-INR
INR: 1.24
Prothrombin Time: 15.5 seconds — ABNORMAL HIGH (ref 11.4–15.2)

## 2018-05-30 LAB — CBC
HCT: 27.1 % — ABNORMAL LOW (ref 39.0–52.0)
HCT: 26.4 % — ABNORMAL LOW (ref 39.0–52.0)
HEMATOCRIT: 31.2 % — AB (ref 39.0–52.0)
HEMOGLOBIN: 10 g/dL — AB (ref 13.0–17.0)
HEMOGLOBIN: 8.4 g/dL — AB (ref 13.0–17.0)
Hemoglobin: 8.9 g/dL — ABNORMAL LOW (ref 13.0–17.0)
MCH: 30.8 pg (ref 26.0–34.0)
MCH: 31.3 pg (ref 26.0–34.0)
MCH: 30.7 pg (ref 26.0–34.0)
MCHC: 32.1 g/dL (ref 30.0–36.0)
MCHC: 32.8 g/dL (ref 30.0–36.0)
MCHC: 31.8 g/dL (ref 30.0–36.0)
MCV: 96 fL (ref 78.0–100.0)
MCV: 95.4 fL (ref 78.0–100.0)
MCV: 96.4 fL (ref 78.0–100.0)
PLATELETS: 148 10*3/uL — AB (ref 150–400)
Platelets: 175 10*3/uL (ref 150–400)
Platelets: 158 10*3/uL (ref 150–400)
RBC: 3.25 MIL/uL — ABNORMAL LOW (ref 4.22–5.81)
RBC: 2.84 MIL/uL — ABNORMAL LOW (ref 4.22–5.81)
RBC: 2.74 MIL/uL — AB (ref 4.22–5.81)
RDW: 14.2 % (ref 11.5–15.5)
RDW: 14.2 % (ref 11.5–15.5)
RDW: 14.6 % (ref 11.5–15.5)
WBC: 7.9 10*3/uL (ref 4.0–10.5)
WBC: 10.9 10*3/uL — AB (ref 4.0–10.5)
WBC: 11.5 10*3/uL — AB (ref 4.0–10.5)

## 2018-05-30 LAB — I-STAT CHEM 8, ED
BUN: 49 mg/dL — ABNORMAL HIGH (ref 8–23)
CREATININE: 1.9 mg/dL — AB (ref 0.61–1.24)
Calcium, Ion: 1.12 mmol/L — ABNORMAL LOW (ref 1.15–1.40)
Chloride: 113 mmol/L — ABNORMAL HIGH (ref 98–111)
Glucose, Bld: 137 mg/dL — ABNORMAL HIGH (ref 70–99)
HEMATOCRIT: 26 % — AB (ref 39.0–52.0)
HEMOGLOBIN: 8.8 g/dL — AB (ref 13.0–17.0)
POTASSIUM: 4.1 mmol/L (ref 3.5–5.1)
SODIUM: 143 mmol/L (ref 135–145)
TCO2: 20 mmol/L — ABNORMAL LOW (ref 22–32)

## 2018-05-30 LAB — COMPREHENSIVE METABOLIC PANEL
ALBUMIN: 2.7 g/dL — AB (ref 3.5–5.0)
ALT: 9 U/L (ref 0–44)
AST: 14 U/L — AB (ref 15–41)
Alkaline Phosphatase: 43 U/L (ref 38–126)
Anion gap: 8 (ref 5–15)
BUN: 52 mg/dL — AB (ref 8–23)
CHLORIDE: 114 mmol/L — AB (ref 98–111)
CO2: 22 mmol/L (ref 22–32)
Calcium: 8.6 mg/dL — ABNORMAL LOW (ref 8.9–10.3)
Creatinine, Ser: 1.85 mg/dL — ABNORMAL HIGH (ref 0.61–1.24)
GFR calc Af Amer: 36 mL/min — ABNORMAL LOW (ref >60)
GFR calc non Af Amer: 31 mL/min — ABNORMAL LOW (ref >60)
GLUCOSE: 145 mg/dL — AB (ref 70–99)
Potassium: 4.2 mmol/L (ref 3.5–5.1)
Sodium: 144 mmol/L (ref 135–145)
Total Bilirubin: 0.7 mg/dL (ref 0.3–1.2)
Total Protein: 5.9 g/dL — ABNORMAL LOW (ref 6.5–8.1)

## 2018-05-30 LAB — TYPE AND SCREEN
ABO/RH(D): O POS
Antibody Screen: NEGATIVE

## 2018-05-30 LAB — HEMOGLOBIN AND HEMATOCRIT, BLOOD
HCT: 27.5 % — ABNORMAL LOW (ref 39.0–52.0)
HEMOGLOBIN: 9.1 g/dL — AB (ref 13.0–17.0)

## 2018-05-30 LAB — POC OCCULT BLOOD, ED: Fecal Occult Bld: POSITIVE — AB

## 2018-05-30 LAB — ABO/RH: ABO/RH(D): O POS

## 2018-05-30 LAB — MRSA PCR SCREENING: MRSA BY PCR: NEGATIVE

## 2018-05-30 SURGERY — ESOPHAGOGASTRODUODENOSCOPY (EGD) WITH PROPOFOL
Anesthesia: Monitor Anesthesia Care

## 2018-05-30 MED ORDER — SODIUM CHLORIDE 0.9 % IV SOLN
80.0000 mg | Freq: Once | INTRAVENOUS | Status: AC
  Administered 2018-05-30: 80 mg via INTRAVENOUS
  Filled 2018-05-30: qty 80

## 2018-05-30 MED ORDER — ACETAMINOPHEN 650 MG RE SUPP: 650.0000 mg | Freq: Four times a day (QID) | RECTAL | Status: DC | PRN

## 2018-05-30 MED ORDER — ONDANSETRON HCL 4 MG/2ML IJ SOLN
4.0000 mg | Freq: Four times a day (QID) | INTRAMUSCULAR | Status: DC | PRN
  Administered 2018-05-30: 4 mg via INTRAVENOUS
  Filled 2018-05-30: qty 2

## 2018-05-30 MED ORDER — PROPOFOL 500 MG/50ML IV EMUL
INTRAVENOUS | Status: DC | PRN
  Administered 2018-05-30: 150 ug/kg/min via INTRAVENOUS

## 2018-05-30 MED ORDER — PANTOPRAZOLE SODIUM 40 MG IV SOLR
40.0000 mg | Freq: Two times a day (BID) | INTRAVENOUS | Status: DC
  Administered 2018-05-30 – 2018-05-31 (×2): 40 mg via INTRAVENOUS
  Filled 2018-05-30 (×3): qty 40

## 2018-05-30 MED ORDER — SODIUM CHLORIDE 0.9 % IV SOLN
8.0000 mg/h | INTRAVENOUS | Status: DC
  Administered 2018-05-30: 8 mg/h via INTRAVENOUS
  Filled 2018-05-30 (×4): qty 80

## 2018-05-30 MED ORDER — PROPOFOL 10 MG/ML IV BOLUS
INTRAVENOUS | Status: DC | PRN
  Administered 2018-05-30: 25 mg via INTRAVENOUS

## 2018-05-30 MED ORDER — ONDANSETRON HCL 4 MG/2ML IJ SOLN
4.0000 mg | Freq: Once | INTRAMUSCULAR | Status: AC
  Administered 2018-05-30: 4 mg via INTRAVENOUS
  Filled 2018-05-30: qty 2

## 2018-05-30 MED ORDER — SODIUM CHLORIDE 0.9 % IV SOLN
INTRAVENOUS | Status: DC
  Administered 2018-05-30 – 2018-05-31 (×3): via INTRAVENOUS

## 2018-05-30 MED ORDER — SODIUM CHLORIDE 0.9 % IV SOLN
INTRAVENOUS | Status: DC
  Administered 2018-05-30: 12:00:00 via INTRAVENOUS

## 2018-05-30 MED ORDER — SODIUM CHLORIDE 0.9 % IV BOLUS
1000.0000 mL | Freq: Once | INTRAVENOUS | Status: AC
  Administered 2018-05-30: 1000 mL via INTRAVENOUS

## 2018-05-30 MED ORDER — SODIUM CHLORIDE 0.9% FLUSH
3.0000 mL | Freq: Two times a day (BID) | INTRAVENOUS | Status: DC
  Administered 2018-05-30 (×2): 3 mL via INTRAVENOUS

## 2018-05-30 MED ORDER — ACETAMINOPHEN 325 MG PO TABS: 650.0000 mg | ORAL_TABLET | Freq: Four times a day (QID) | ORAL | Status: DC | PRN

## 2018-05-30 MED ORDER — ALBUTEROL SULFATE (2.5 MG/3ML) 0.083% IN NEBU: 2.5000 mg | INHALATION_SOLUTION | Freq: Four times a day (QID) | RESPIRATORY_TRACT | Status: DC | PRN

## 2018-05-30 MED ORDER — ONDANSETRON HCL 4 MG PO TABS: 4.0000 mg | ORAL_TABLET | Freq: Four times a day (QID) | ORAL | Status: DC | PRN

## 2018-05-30 SURGICAL SUPPLY — 14 items

## 2018-05-30 NOTE — Anesthesia Procedure Notes (Signed)
Procedure Name: MAC Date/Time: 05/30/2018 12:58 PM Performed by: Imagene Riches, CRNA Pre-anesthesia Checklist: Patient identified, Emergency Drugs available, Suction available, Patient being monitored and Timeout performed Patient Re-evaluated:Patient Re-evaluated prior to induction Oxygen Delivery Method: Nasal cannula Preoxygenation: Pre-oxygenation with 100% oxygen Induction Type: IV induction

## 2018-05-30 NOTE — Consult Note (Signed)
Referring Provider: TH Primary Care Physician:  Renford Dills, MD Primary Gastroenterologist:  Dr. Madilyn Fireman   Reason for Consultation:  GI bleed   HPI: Derek Stephens is a 84 y.o. male with past medical history of arthritis and hypertension presented to the hospital early morning today for further evaluation of coffee-ground emesis and black color stool.he started having nausea vomiting followed by large amount of coffee-ground emesis last night. Denied any associated abdominal pain. Complaining of black tarry stool but denied any bright blood per rectum. Denies any previous diarrhea or constipation. Very occasional NSAID use. Denied any previous history of trouble swallowing or pain with swallowing.  Last colonoscopy last year was normal according to patient. Repeat was not recommended because of age.  Past Medical History:  Diagnosis Date  . Arthritis    lumbar degeneration   . Hypertension    PCP - does ekg. annually - last done 08/2011    Past Surgical History:  Procedure Laterality Date  . COLONOSCOPY     2009- wnl   . LUMBAR LAMINECTOMY/DECOMPRESSION MICRODISCECTOMY  03/23/2012   Procedure: LUMBAR LAMINECTOMY/DECOMPRESSION MICRODISCECTOMY 2 LEVELS;  Surgeon: Hewitt Shorts, MD;  Location: [facility];  Service: Neurosurgery;  Laterality: N/A;  Lumbar Two-Three, Lumbar Three-Four Lumbar Laminectomy    Prior to Admission medications   Medication Sig Start Date End Date Taking? Authorizing Provider  allopurinol (ZYLOPRIM) 100 MG tablet Take 100 mg by mouth daily with breakfast.    Yes [provider]  amLODipine (NORVASC) 10 MG tablet Take 10 mg by mouth daily.   Yes [provider]  aspirin 81 MG chewable tablet Chew 81 mg by mouth daily.   Yes [provider]  Cholecalciferol (VITAMIN D PO) Take 1 tablet by mouth daily.   Yes [provider]  TURMERIC PO Take 1 tablet by mouth daily.   Yes [provider]  Tamsulosin HCl (FLOMAX)  0.4 MG CAPS Take 1 capsule (0.4 mg total) by mouth daily. Patient not taking: Reported on 05/30/2018 03/24/12   Shirlean Kelly, MD    Scheduled Meds: . sodium chloride flush  3 mL Intravenous Q12H   Continuous Infusions: . sodium chloride 75 mL/hr at 05/30/18 0800  . pantoprozole (PROTONIX) infusion 8 mg/hr (05/30/18 0700)   PRN Meds:.acetaminophen **OR** acetaminophen, albuterol, ondansetron **OR** ondansetron (ZOFRAN) IV  Allergies as of 05/30/2018  . (No Known Allergies)    History reviewed. No pertinent family history.  Social History   Socioeconomic History  . Marital status: Married    Spouse name: Not on file  . Number of children: Not on file  . Years of education: Not on file  . Highest education level: Not on file  Occupational History  . Not on file  Social Needs  . Financial resource strain: Not hard at all  . Food insecurity:    Worry: Never true    Inability: Never true  . Transportation needs:    Medical: No    Non-medical: No  Tobacco Use  . Smoking status: Former Smoker    Last attempt to quit: 03/17/1987    Years since quitting: 31.2  . Smokeless tobacco: Never Used  Substance and Sexual Activity  . Alcohol use: No  . Drug use: No  . Sexual activity: Yes  Lifestyle  . Physical activity:    Days per week: 3 days    Minutes per session: 30 min  . Stress: Not at all  Relationships  . Social connections:  Talks on phone: More than three times a week    Gets together: Twice a week    Attends religious service: More than 4 times per year    Active member of club or organization: No    Attends meetings of clubs or organizations: Never    Relationship status: Married  . Intimate partner violence:    Fear of current or ex partner: No    Emotionally abused: No    Physically abused: No    Forced sexual activity: No  Other Topics Concern  . Not on file  Social History Narrative  . Not on file    Review of Systems: Review of Systems   Constitutional: Negative for chills and fever.  HENT: Negative for hearing loss and tinnitus.   Eyes: Negative for blurred vision and double vision.  Respiratory: Negative for cough and hemoptysis.   Cardiovascular: Negative for chest pain and palpitations.  Gastrointestinal: Positive for heartburn, melena, nausea and vomiting. Negative for abdominal pain, blood in stool, constipation and diarrhea.  Genitourinary: Negative for dysuria and urgency.  Musculoskeletal: Negative for myalgias and neck pain.  Skin: Negative for itching and rash.  Neurological: Negative for seizures and loss of consciousness.  Endo/Heme/Allergies: Does not bruise/bleed easily.  Psychiatric/Behavioral: Negative for hallucinations and suicidal ideas.    Physical Exam: Vital signs: Vitals:   05/30/18 0434 05/30/18 0806  BP: 117/76   Pulse: 84   Resp: (!) 21   Temp:  98.2 F (36.8 C)  SpO2: 96%    Last BM Date: 05/30/18 Physical Exam  Constitutional: He is oriented to person, place, and time. He appears well-developed and well-nourished. No distress.  HENT:  Head: Normocephalic and atraumatic.  Mouth/Throat: Oropharynx is clear and moist. No oropharyngeal exudate.  Eyes: EOM are normal. No scleral icterus.  Neck: Normal range of motion. Neck supple.  Cardiovascular: Normal rate, regular rhythm and normal heart sounds.  Pulmonary/Chest: Effort normal and breath sounds normal. No respiratory distress.  Abdominal: Soft. Bowel sounds are normal. He exhibits no distension. There is no tenderness. There is no rebound and no guarding.  Musculoskeletal: Normal range of motion. He exhibits no edema.  Neurological: He is alert and oriented to person, place, and time.  Skin: Skin is warm. No erythema.  Psychiatric: He has a normal mood and affect. Judgment normal.  Vitals reviewed.  GI:  Lab Results: Recent Labs    05/30/18 0131 05/30/18 0141 05/30/18 0537 05/30/18 0904  WBC 7.9  --   --  10.9*  HGB  10.0* 8.8* 9.1* 8.9*  HCT 31.2* 26.0* 27.5* 27.1*  PLT 175  --   --  148*   BMET Recent Labs    05/30/18 0131 05/30/18 0141  NA 144 143  K 4.2 4.1  CL 114* 113*  CO2 22  --   GLUCOSE 145* 137*  BUN 52* 49*  CREATININE 1.85* 1.90*  CALCIUM 8.6*  --    LFT Recent Labs    05/30/18 0131  PROT 5.9*  ALBUMIN 2.7*  AST 14*  ALT 9  ALKPHOS 43  BILITOT 0.7   PT/INR Recent Labs    05/30/18 0131  LABPROT 15.5*  INR 1.24     Studies/Results: Dg Chest Port 1 View  Result Date: 05/30/2018 CLINICAL DATA:  84 year old male with cough. EXAM: PORTABLE CHEST 1 VIEW COMPARISON:  Chest radiograph dated 03/16/2012 FINDINGS: Bibasilar linear atelectasis/scarring. No focal consolidation, pleural effusion, or pneumothorax. The cardiac silhouette is within normal limits. No acute osseous  pathology. IMPRESSION: No active disease. Electronically Signed   By: Elgie Collard M.D.   On: 05/30/2018 04:31    Impression/Plan: - coffee-ground emesis and melena.most likely ulcer disease versus Mallory-Weiss tear. Normal INR. Normal LFTs. No history of liver disease. - Acute blood loss anemia - occult blood positive stool. Most likely from upper GI bleed  Recommendations -------------------------- - EGD today. - continue PPI. Monitor H&H.  Risks (bleeding, infection, bowel perforation that could require surgery, sedation-related changes in cardiopulmonary systems), benefits (identification and possible treatment of source of symptoms, exclusion of certain causes of symptoms), and alternatives (watchful waiting, radiographic imaging studies, empiric medical treatment)  were explained to patient in detail and patient wishes to proceed.    LOS: 0 days   Kathi Der  MD, FACP 05/30/2018, 11:33 AM  Contact #  [phone]

## 2018-05-30 NOTE — Progress Notes (Signed)
Patient admitted earlier this morning.  H&P reviewed.  Patient seen and examined.  Patient had another episode of black stool this morning.  No further episodes of hematemesis.  His wife and son at the bedside.  Patient denies any abdominal pain.  Some complains of generalized weakness.  Vital signs reviewed and noted to be stable.  Lungs are clear to auscultation bilaterally S1-S2 is normal regular.  No S3-S4.  No rubs murmurs or bruit Abdomen soft.  Nontender nondistended.  Bowel sounds are present.  No masses organomegaly  Hemoglobin 8.9 .  Creatinine 1.9.  Potassium 4.1.  Patient appears to have upper GI bleed with hematemesis and melanotic stools.  [facility] has been consulted.  PPI infusion.  He is hemodynamically stable.  There is some acute blood loss anemia.  Continue to monitor hemoglobin and transfuse as needed.  Patient will likely need to undergo upper endoscopy.  Rest of the plan as per H&P.  We will continue to monitor closely.  Derek ShipperGokul Mishaal Stephens 05/30/2018

## 2018-05-30 NOTE — ED Provider Notes (Signed)
[facility]   CSN: [id number] Arrival date & time: 05/30/18  0109     History   Chief Complaint Chief Complaint  Patient presents with  . GI Bleeding  . Loss of Consciousness    HPI Derek Stephens is a 84 y.o. male.  HPI  This is an 84 year old male with a history of hypertension who presents with hematemesis.  Per EMS, they were called out for bloody emesis.  Patient reports tonight he had bright red blood and clot in his vomit.  He states that he felt somewhat dizzy today but went to bed early.  He had not noted any blood in his stools or black stools.  He does report taking a laxative Sunday night and had a normal bowel movement this morning.  He does take an aspirin daily.  He has taken 1 Aleve p.m. but denies any ongoing NSAID use.  No history of GI bleeds, alcohol abuse, liver disorders.  Denies any anticoagulation.  Per EMS, noted to have multiple episodes of hematemesis with clot.  He also had a syncopal episode and EMS had difficulty palpating a pulse.  He did return to normal mentation.  Last blood pressure 90 systolic.  Patient denies any abdominal pain.  Past Medical History:  Diagnosis Date  . Arthritis    lumbar degeneration   . Hypertension    PCP - does ekg. annually - last done 08/2011    There are no active problems to display for this patient.   Past Surgical History:  Procedure Laterality Date  . COLONOSCOPY     20 09- wnl   . LUMBAR LAMINECTOMY/DECOMPRESSION MICRODISCECTOMY  03/23/2012   Procedure: LUMBAR LAMINECTOMY/DECOMPRESSION MICRODISCECTOMY 2 LEVELS;  Surgeon: Hewitt Shortsobert W Nudelman, MD;  Location: [facility];  Service: Neurosurgery;  Laterality: N/A;  Lumbar Two-Three, Lumbar Three-Four Lumbar Laminectomy        Home Medications    Prior to Admission medications   Medication Sig Start Date End Date Taking? Authorizing Provider  allopurinol (ZYLOPRIM) 100 MG tablet Take 100 mg by mouth daily  with breakfast.    Yes [provider]  amLODipine (NORVASC) 10 MG tablet Take 10 mg by mouth daily.   Yes [provider]  aspirin 81 MG chewable tablet Chew 81 mg by mouth daily.   Yes [provider]  Cholecalciferol (VITAMIN D PO) Take 1 tablet by mouth daily.   Yes [provider]  TURMERIC PO Take 1 tablet by mouth daily.   Yes [provider]  Tamsulosin HCl (FLOMAX) 0.4 MG CAPS Take 1 capsule (0.4 mg total) by mouth daily. Patient not taking: Reported on 05/30/2018 03/24/12   Shirlean KellyNudelman, Robert, MD    Family History No family history on file.  Social History Social History   Tobacco Use  . Smoking status: Former Smoker    Last attempt to quit: 03/17/1987    Years since quitting: 31.2  . Smokeless tobacco: Never Used  Substance Use Topics  . Alcohol use: No  . Drug use: No     Allergies   Patient has no known allergies.   Review of Systems Review of Systems  Constitutional: Negative for fever.  Respiratory: Negative for shortness of breath.   Cardiovascular: Negative for chest pain.  Gastrointestinal: Positive for blood in stool, nausea and vomiting. Negative for abdominal pain, constipation and diarrhea.  Genitourinary: Negative for dysuria.  Neurological: Positive for dizziness, syncope and light-headedness.  All other systems reviewed  and are negative.    Physical Exam Updated Vital Signs BP 108/63   Pulse 78   Temp 98.2 F (36.8 C) (Oral)   Resp (!) 21   SpO2 96%   Physical Exam  Constitutional: He is oriented to person, place, and time.  Nontoxic-appearing  HENT:  Head: Normocephalic and atraumatic.  Blood noted in the oropharynx as well as the bilateral naris, no active epistaxis  Eyes: Pupils are equal, round, and reactive to light.  Neck: Neck supple.  Cardiovascular: Normal rate, regular rhythm and normal heart sounds.  No murmur heard. Pulmonary/Chest: Effort normal and breath sounds normal. No  respiratory distress. He has no wheezes.  Abdominal: Soft. There is no tenderness. There is no rebound.  Increased bowel sounds  Genitourinary: Rectal exam shows guaiac positive stool.  Musculoskeletal: He exhibits no edema.  Lymphadenopathy:    He has no cervical adenopathy.  Neurological: He is alert and oriented to person, place, and time.  Skin: Skin is warm and dry.  Psychiatric: He has a normal mood and affect.  Nursing note and vitals reviewed.    ED Treatments / Results  Labs (all labs ordered are listed, but only abnormal results are displayed) Labs Reviewed  COMPREHENSIVE METABOLIC PANEL - Abnormal; Notable for the following components:      Result Value   Chloride 114 (*)    Glucose, Bld 145 (*)    BUN 52 (*)    Creatinine, Ser 1.85 (*)    Calcium 8.6 (*)    Total Protein 5.9 (*)    Albumin 2.7 (*)    AST 14 (*)    GFR calc non Af Amer 31 (*)    GFR calc Af Amer 36 (*)    All other components within normal limits  CBC - Abnormal; Notable for the following components:   RBC 3.25 (*)    Hemoglobin 10.0 (*)    HCT 31.2 (*)    All other components within normal limits  PROTIME-INR - Abnormal; Notable for the following components:   Prothrombin Time 15.5 (*)    All other components within normal limits  POC OCCULT BLOOD, ED - Abnormal; Notable for the following components:   Fecal Occult Bld POSITIVE (*)    All other components within normal limits  I-STAT CHEM 8, ED - Abnormal; Notable for the following components:   Chloride 113 (*)    BUN 49 (*)    Creatinine, Ser 1.90 (*)    Glucose, Bld 137 (*)    Calcium, Ion 1.12 (*)    TCO2 20 (*)    Hemoglobin 8.8 (*)    HCT 26.0 (*)    All other components within normal limits  TYPE AND SCREEN  ABO/RH    EKG None  Radiology No results found.  Procedures Procedures (including critical care time)  CRITICAL CARE Performed by: Shon Baton   Total critical care time: 45 minutes  Critical care  time was exclusive of separately billable procedures and treating other patients.  Critical care was necessary to treat or prevent imminent or life-threatening deterioration.  Critical care was time spent personally by me on the following activities: development of treatment plan with patient and/or surrogate as well as nursing, discussions with consultants, evaluation of patient's response to treatment, examination of patient, obtaining history from patient or surrogate, ordering and performing treatments and interventions, ordering and review of laboratory studies, ordering and review of radiographic studies, pulse oximetry and re-evaluation of patient's condition.  Medications Ordered in ED Medications  pantoprazole (PROTONIX) 80 mg in sodium chloride 0.9 % 250 mL (0.32 mg/mL) infusion (has no administration in time range)  sodium chloride 0.9 % bolus 1,000 mL (1,000 mLs Intravenous New Bag/Given 05/30/18 0130)  pantoprazole (PROTONIX) 80 mg in sodium chloride 0.9 % 100 mL IVPB (0 mg Intravenous Stopped 05/30/18 0248)  ondansetron (ZOFRAN) injection 4 mg (4 mg Intravenous Given 05/30/18 0128)     Initial Impression / Assessment and Plan / ED Course  I have reviewed the triage vital signs and the nursing notes.  Pertinent labs & imaging results that were available during my care of the patient were reviewed by me and considered in my medical decision making (see chart for details).  Clinical Course as of May 31 303  Tue May 30, 2018  0245 Patient discussed with Dr. Russella Dar, GI.  Recommends endoscopy first thing in the morning.  Call if any changes in hemodynamic status or evidence of recurrent active bleeding   [CH]    Clinical Course User Index [CH] Johonna Binette, Mayer Masker, MD    Patient presents with hematemesis and syncope from home.  Vital signs stabilized in route.  No active bleeding at this time but he is covered with clots and blood.  He is fairly low risk.  He does take daily aspirin  but otherwise no blood thinners.  Denies history of liver disease or alcohol abuse.  Initial vital signs are reassuring.  Abdominal exam is nontender.  Patient typed and screened.  2 large-bore IVs placed.  Patient given fluids.  N.p.o.  He did have one large maroon-colored bowel movement that was heme positive.  After Zofran and Protonix IV with Protonix drip, he did not have any further hematemesis.  Hemoglobin is 10.  Last known hemoglobin in 2013 was normal.  BUN is elevated at 52.  Patient was discussed with on-call GI.  Plan for admission to the stepdown unit with endoscopy first thing in the morning.  Patient was discussed with hospitalist.  Advised if any clinical change in status, GI should be emergently consulted for endoscopy.    Final Clinical Impressions(s) / ED Diagnoses   Final diagnoses:  Acute blood loss anemia  Gastrointestinal hemorrhage, unspecified gastrointestinal hemorrhage type    ED Discharge Orders    None       Shon Baton, MD 05/30/18 [phone]

## 2018-05-30 NOTE — ED Triage Notes (Signed)
[facility] blood that woke him up, denies abd pain. Pt had syncopal episode with EMS and episode of projectile vomiting with large clots.

## 2018-05-30 NOTE — H&P (Addendum)
History and Physical    Derek Stephens ZOX:[id number] DOB: [birth year] DOA: 05/30/2018  Referring MD/NP/PA: Dr. Wilkie Aye PCP: Renford Dills, MD  Patient coming from: Home via EMS  Chief Complaint: Vomiting blood  I have personally briefly reviewed patient's old medical records in [facility]   HPI: Derek Stephens is a 84 y.o. male with medical history significant of HTN, CKD, BPH, and gout; who presents with complaints of vomiting up blood starting around 11:30 PM last night.  Patient had been having some mild constipation and notes taking a laxative the night before.  Thereafter he had had a couple of bowel movements the following day.  He does not regularly take NSAIDs, but reported taking 2 Tylenol PM last night to try and sleep before going to bed sometime around 8 pm.   Patient reports waking up  around 11:30 pm and having multiple episodes of emesis.  Emesis contained bright red blood with clots.  Associated symptoms included feeling lightheaded, melena, diaphoresis, and nonproductive cough. He also takes a daily aspirin, but denies any other blood thinners.  Denies having any abdominal pain, chest pain, shortness of breath, or dysuria.  En route with EMS he was noted to have a syncopal event with difficulty palpate pulse.  However, patient was reported to have regained consciousness with normal mentation shortly thereafter.  ED Course: Upon admission to the emergency department patient was noted to afebrile, respirations 16-27, and all other vital signs maintained.  Labs revealed hemoglobin 10, BUN 52, creatinine 1.85.  Stool guaiac positive.  Patient was typed and screen for possible need of blood products.  He was started on Protonix drip and given 1 L of normal saline IV fluids.  Dr. Russella Dar of gastroenterology was consulted.  TR[facility] called to admit.  Review of Systems  Constitutional: Positive for diaphoresis. Negative for chills, fever and weight loss.  HENT: Negative for congestion  and ear discharge.   Eyes: Negative for pain and discharge.  Respiratory: Positive for cough. Negative for sputum production.   Cardiovascular: Negative for chest pain and leg swelling.  Gastrointestinal: Positive for melena, nausea and vomiting. Negative for abdominal pain.  Genitourinary: Negative for dysuria and hematuria.  Musculoskeletal: Positive for back pain. Negative for falls.  Skin: Negative for rash.  Neurological: Positive for loss of consciousness. Negative for focal weakness.  Endo/Heme/Allergies: Does not bruise/bleed easily.  Psychiatric/Behavioral: Negative for memory loss and substance abuse.     Past Medical History:  Diagnosis Date  . Arthritis    lumbar degeneration   . Hypertension    PCP - does ekg. annually - last done 08/2011    Past Surgical History:  Procedure Laterality Date  . COLONOSCOPY     2009- wnl   . LUMBAR LAMINECTOMY/DECOMPRESSION MICRODISCECTOMY  03/23/2012   Procedure: LUMBAR LAMINECTOMY/DECOMPRESSION MICRODISCECTOMY 2 LEVELS;  Surgeon: Hewitt Shorts, MD;  Location: [facility];  Service: Neurosurgery;  Laterality: N/A;  Lumbar Two-Three, Lumbar Three-Four Lumbar Laminectomy     reports that he quit smoking about 31 years ago. He has never used smokeless tobacco. He reports that he does not drink alcohol or use drugs.  No Known Allergies  History reviewed. No pertinent family history.  Prior to Admission medications   Medication Sig Start Date End Date Taking? Authorizing Provider  allopurinol (ZYLOPRIM) 100 MG tablet Take 100 mg by mouth daily with breakfast.    Yes [provider]  amLODipine (NORVASC) 10 MG tablet Take 10 mg by mouth daily.  Yes [provider]  aspirin 81 MG chewable tablet Chew 81 mg by mouth daily.   Yes [provider]  Cholecalciferol (VITAMIN D PO) Take 1 tablet by mouth daily.   Yes [provider]  TURMERIC PO Take 1 tablet by mouth daily.   Yes [provider]  Tamsulosin HCl (FLOMAX) 0.4 MG CAPS Take 1 capsule (0.4 mg total) by mouth daily. Patient not taking: Reported on 05/30/2018 03/24/12   Shirlean KellyNudelman, Robert, MD    Physical Exam:  Constitutional: Elderly male in NAD, calm, comfortable Vitals:   05/30/18 0130 05/30/18 0145 05/30/18 0200 05/30/18 0230  BP: 112/70 113/69 115/65 108/63  Pulse: 85 80 75 78  Resp: (!) 27 (!) 21 16 (!) 21  Temp:      TempSrc:      SpO2: 94% 96% 96% 96%   Eyes: PERRL, lids and conjunctivae normal ENMT: Mucous membranes are moist. Posterior pharynx clear of any exudate or lesions.  Neck: normal, supple, no masses, no thyromegaly Respiratory: clear to auscultation bilaterally, no wheezing, no crackles. Normal respiratory effort. No accessory muscle use.  Cardiovascular: Regular rate and rhythm, no murmurs / rubs / gallops. No extremity edema. 2+ pedal pulses. No carotid bruits.  Abdomen: no tenderness, no masses palpated. No hepatosplenomegaly. Bowel sounds positive.  Musculoskeletal: no clubbing / cyanosis. No joint deformity upper and lower extremities. Good ROM, no contractures. Normal muscle tone.  Skin: no rashes, lesions, ulcers. No induration Neurologic: CN 2-12 grossly intact. Sensation intact, DTR normal. Strength 5/5 in all 4.  Psychiatric: Normal judgment and insight. Alert and oriented x 3. Normal mood.     Labs on Admission: I have personally reviewed following labs and imaging studies  CBC: Recent Labs  Lab 05/30/18 0131 05/30/18 0141  WBC 7.9  --   HGB 10.0* 8.8*  HCT 31.2* 26.0*  MCV 96.0  --   PLT 175  --    Basic Metabolic Panel: Recent Labs  Lab 05/30/18 0131 05/30/18 0141  NA 144 143  K 4.2 4.1  CL 114* 113*  CO2 22  --   GLUCOSE 145* 137*  BUN 52* 49*  CREATININE 1.85* 1.90*  CALCIUM 8.6*  --    GFR: CrCl cannot be calculated (Unknown ideal weight.). Liver Function Tests: Recent Labs  Lab 05/30/18 0131  AST 14*  ALT 9  ALKPHOS 43  BILITOT 0.7  PROT 5.9*    ALBUMIN 2.7*   No results for input(s): LIPASE, AMYLASE in the last 168 hours. No results for input(s): AMMONIA in the last 168 hours. Coagulation Profile: Recent Labs  Lab 05/30/18 0131  INR 1.24   Cardiac Enzymes: No results for input(s): CKTOTAL, CKMB, CKMBINDEX, TROPONINI in the last 168 hours. BNP (last 3 results) No results for input(s): PROBNP in the last 8760 hours. HbA1C: No results for input(s): HGBA1C in the last 72 hours. CBG: No results for input(s): GLUCAP in the last 168 hours. Lipid Profile: No results for input(s): CHOL, HDL, LDLCALC, TRIG, CHOLHDL, LDLDIRECT in the last 72 hours. Thyroid Function Tests: No results for input(s): TSH, T4TOTAL, FREET4, T3FREE, THYROIDAB in the last 72 hours. Anemia Panel: No results for input(s): VITAMINB12, FOLATE, FERRITIN, TIBC, IRON, RETICCTPCT in the last 72 hours. Urine analysis: No results found for: COLORURINE, APPEARANCEUR, LABSPEC, PHURINE, GLUCOSEU, HGBUR, BILIRUBINUR, KETONESUR, PROTEINUR, UROBILINOGEN, NITRITE, LEUKOCYTESUR Sepsis Labs: No results found for this or any previous visit (from the past 240 hour(s)).   Radiological Exams on Admission: No results found.  Chest x-ray: Independently reviewed.  No acute abnormalities noted.  Assessment/Plan Upper gastrointestinal bleeding, acute blood loss anemia: Patient presents with reports of hematemesis and melena.  Initial hemoglobin noted to be 10 with positive guaiac.  Hemoglobin last noted to be within normal limits back in 2013.  Patient was typed and screened for possible need of blood products.  Dr. Russella Dar of GI consulted.. - Admit to a stepdown bed - NPO - Continue Protonix drip - Hold aspirin - Serial hemoglobin monitoring - Transfuse blood products as needed - Antiemetics as needed - Appreciate GI consultative services, will follow-up for further recommendations.   Syncope: Acute.  Suspect symptoms vasovagal in nature related with acute blood loss. -  Follow-up telemetry overnight  Acute kidney injury superimposed on chronic kidney disease: Patient presents with a creatinine of 1.85 with BUN 52 on admission.  Last creatinine on file 1.42 in 2013. - IV fluids at 75 ml/ hr  - Recheck kidney function in a.m  Essential hypertension - Restart amlodipine when medically appropriate  History of gout: Patient without acute gout flare at this time. - Restart allopurinol when medically  DVT prophylaxis: SCDs Code Status: Full Family Communication: Discussed plan of care with the patient and family present bedside Disposition Plan: Discharge home once medically stable Consults called: GI  Admission status: observation  Clydie Braun MD [facility] Pager [phone]   If 7PM-7AM, please contact night-coverage [email or web address] Password [email or web address]  05/30/2018, 3:09 AM

## 2018-05-30 NOTE — Brief Op Note (Signed)
05/30/2018  1:20 PM  PATIENT:  Derek Stephens  84 y.o. male  PRE-OPERATIVE DIAGNOSIS:  upper GI bleed  POST-OPERATIVE DIAGNOSIS:  Gastric and duodenal ulcers, biopsied  PROCEDURE:  Procedure(s): ESOPHAGOGASTRODUODENOSCOPY (EGD) WITH PROPOFOL (N/A) BIOPSY  SURGEON:  Surgeon(s) and Role:    * Taaj Hurlbut, MD - Primary   Findings ---------- - EGD showed multiple small clean-based gastric ulcer as well as 2 small linear duodenal bulb ulcers. No evidence of active bleeding. Diffuse chronic gastritis and lower esophageal ring.  Recommendations ------------------------- - Change Protonix to IV twice a day. - Monitor H&H. - Diet, advance as tolerated - GI will follow  Kathi DerParag Dolly Harbach MD, FACP 05/30/2018, 1:21 PM  Contact #  [phone]

## 2018-05-30 NOTE — Op Note (Signed)
[facility] Patient Name: Derek Stephens Procedure Date : 05/30/2018 MRN: [id number] Attending MD: Kathi Der , MD Date of Birth: June 13, 1930 CSN: [id number] Age: 84 Admit Type: Inpatient Procedure:                Upper GI endoscopy Indications:              Coffee-ground emesis, Melena Providers:                Kathi Der, MD, Clearnce Sorrel, RN, Kandice Robinsons, Technician Referring MD:              Medicines:                Sedation Administered by an Anesthesia Professional Complications:            No immediate complications. Estimated Blood Loss:     Estimated blood loss was minimal. Procedure:                Pre-Anesthesia Assessment:                           - Prior to the procedure, a History and Physical                            was performed, and patient medications and                            allergies were reviewed. The patient's tolerance of                            previous anesthesia was also reviewed. The risks                            and benefits of the procedure and the sedation                            options and risks were discussed with the patient.                            All questions were answered, and informed consent                            was obtained. Prior Anticoagulants: The patient has                            taken no previous anticoagulant or antiplatelet                            agents. ASA Grade Assessment: II - A patient with                            mild systemic disease. After reviewing the risks  and benefits, the patient was deemed in                            satisfactory condition to undergo the procedure.                           After obtaining informed consent, the endoscope was                            passed under direct vision. Throughout the                            procedure, the patient's blood pressure, pulse, and                  oxygen saturations were monitored continuously. The                            GIF-H190 ([id number] Olympus Adult EGD was introduced                            through the mouth, and advanced to the second part                            of duodenum. The upper GI endoscopy was                            accomplished without difficulty. The patient                            tolerated the procedure well. Scope In: Scope Out: Findings:      A non-obstructing Schatzki ring was found at the gastroesophageal       junction at 40 cm .      The exam of the esophagus was otherwise normal.      Few non-bleeding superficial gastric ulcers with no stigmata of bleeding       were found in the gastric body, in the gastric antrum and in the       prepyloric region of the stomach. Biopsies were taken with a cold       forceps for histology.      The cardia and gastric fundus were normal on retroflexion.      Diffuse moderate inflammation characterized by congestion (edema),       erosions and erythema was found in the entire examined stomach.      Two linear duodenal ulcers with no stigmata of bleeding were found in       the duodenal bulb. Biopsies were taken with a cold forceps for histology.      The first portion of the duodenum and second portion of the duodenum       were normal. Impression:               - Non-obstructing Schatzki ring.                           - Non-bleeding gastric ulcers with no stigmata of  bleeding. Biopsied.                           - Gastritis.                           - Multiple duodenal ulcers with no stigmata of                            bleeding. Biopsied.                           - Normal first portion of the duodenum and second                            portion of the duodenum. Recommendation:           - Return patient to hospital ward for ongoing care.                           - Soft diet.                            - Continue present medications.                           - Await pathology results.                           - Repeat upper endoscopy after studies are complete                            for surveillance based on pathology results. Procedure Code(s):        --- Professional ---                           [phone], Esophagogastroduodenoscopy, flexible,                            transoral; with biopsy, single or multiple Diagnosis Code(s):        --- Professional ---                           K22.2, Esophageal obstruction                           K25.9, Gastric ulcer, unspecified as acute or                            chronic, without hemorrhage or perforation                           K26.9, Duodenal ulcer, unspecified as acute or                            chronic, without hemorrhage or perforation  K92.0, Hematemesis                           K92.1, Melena (includes Hematochezia) CPT copyright 2017 American Medical Association. All rights reserved. The codes documented in this report are preliminary and upon coder review may  be revised to meet current compliance requirements. Kathi Der, MD Kathi Der, MD 05/30/2018 1:19:56 PM Number of Addenda: 0

## 2018-05-30 NOTE — Transfer of Care (Signed)
Immediate Anesthesia Transfer of Care Note  Patient: Derek Stephens  Procedure(s) Performed: ESOPHAGOGASTRODUODENOSCOPY (EGD) WITH PROPOFOL (N/A ) BIOPSY  Patient Location: Endoscopy Unit  Anesthesia Type:MAC  Level of Consciousness: drowsy  Airway & Oxygen Therapy: Patient Spontanous Breathing and Patient connected to nasal cannula oxygen  Post-op Assessment: Report given to RN and Post -op Vital signs reviewed and stable  Post vital signs: Reviewed and stable  Last Vitals:  Vitals Value Taken Time  BP    Temp    Pulse    Resp    SpO2      Last Pain:  Vitals:   05/30/18 1210  TempSrc: Oral  PainSc: 0-No pain         Complications: No apparent anesthesia complications

## 2018-05-30 NOTE — Progress Notes (Signed)
Patient arrived from the emergency room via stretcher, alert and oriented x 4.  Skin intact, placed on progressive care monitor, protonix running, wife at bedside.

## 2018-05-30 NOTE — Anesthesia Preprocedure Evaluation (Signed)
Anesthesia Evaluation  Patient identified by MRN, date of birth, ID band Patient awake    Reviewed: Allergy & Precautions, NPO status , Patient's Chart, lab work & pertinent test results  Airway Mallampati: I  TM Distance: >3 FB Neck ROM: Full    Dental   Pulmonary former smoker,    Pulmonary exam normal        Cardiovascular hypertension, Pt. on medications Normal cardiovascular exam     Neuro/Psych    GI/Hepatic   Endo/Other    Renal/GU      Musculoskeletal   Abdominal   Peds  Hematology   Anesthesia Other Findings   Reproductive/Obstetrics                             Anesthesia Physical Anesthesia Plan  ASA: II  Anesthesia Plan: MAC   Post-op Pain Management:    Induction: Intravenous  PONV Risk Score and Plan: 1 and Ondansetron and Treatment may vary due to age or medical condition  Airway Management Planned: Simple Face Mask  Additional Equipment:   Intra-op Plan:   Post-operative Plan: Extubation in OR  Informed Consent: I have reviewed the patients History and Physical, chart, labs and discussed the procedure including the risks, benefits and alternatives for the proposed anesthesia with the patient or authorized representative who has indicated his/her understanding and acceptance.     Plan Discussed with: Surgeon and CRNA  Anesthesia Plan Comments:         Anesthesia Quick Evaluation

## 2018-05-30 NOTE — Anesthesia Postprocedure Evaluation (Signed)
Anesthesia Post Note  Patient: Derek Stephens  Procedure(s) Performed: ESOPHAGOGASTRODUODENOSCOPY (EGD) WITH PROPOFOL (N/A ) BIOPSY     Patient location during evaluation: PACU Anesthesia Type: MAC Level of consciousness: awake and alert Pain management: pain level controlled Vital Signs Assessment: post-procedure vital signs reviewed and stable Respiratory status: spontaneous breathing, nonlabored ventilation, respiratory function stable and patient connected to nasal cannula oxygen Cardiovascular status: stable and blood pressure returned to baseline Postop Assessment: no apparent nausea or vomiting Anesthetic complications: no    Last Vitals:  Vitals:   05/30/18 1337 05/30/18 1519  BP: (!) 111/49   Pulse: 76   Resp: 15   Temp:  37.2 C  SpO2: 95%     Last Pain:  Vitals:   05/30/18 1519  TempSrc: Oral  PainSc:                  Wagner Tanzi DAVID

## 2018-05-31 DIAGNOSIS — K921 Melena: Secondary | ICD-10-CM | POA: Diagnosis not present

## 2018-05-31 DIAGNOSIS — I10 Essential (primary) hypertension: Secondary | ICD-10-CM

## 2018-05-31 DIAGNOSIS — R05 Cough: Secondary | ICD-10-CM

## 2018-05-31 DIAGNOSIS — R059 Cough, unspecified: Secondary | ICD-10-CM

## 2018-05-31 DIAGNOSIS — D62 Acute posthemorrhagic anemia: Secondary | ICD-10-CM | POA: Diagnosis not present

## 2018-05-31 DIAGNOSIS — Z8739 Personal history of other diseases of the musculoskeletal system and connective tissue: Secondary | ICD-10-CM | POA: Diagnosis not present

## 2018-05-31 DIAGNOSIS — K922 Gastrointestinal hemorrhage, unspecified: Secondary | ICD-10-CM

## 2018-05-31 LAB — IRON AND TIBC
Iron: 26 ug/dL — ABNORMAL LOW (ref 45–182)
Saturation Ratios: 9 % — ABNORMAL LOW (ref 17.9–39.5)
TIBC: 287 ug/dL (ref 250–450)
UIBC: 261 ug/dL

## 2018-05-31 LAB — HEMOGLOBIN AND HEMATOCRIT, BLOOD
HCT: 27.2 % — ABNORMAL LOW (ref 39.0–52.0)
HEMOGLOBIN: 8.9 g/dL — AB (ref 13.0–17.0)

## 2018-05-31 LAB — BASIC METABOLIC PANEL
Anion gap: 7 (ref 5–15)
BUN: 36 mg/dL — ABNORMAL HIGH (ref 8–23)
CALCIUM: 8.3 mg/dL — AB (ref 8.9–10.3)
CO2: 18 mmol/L — AB (ref 22–32)
CREATININE: 1.79 mg/dL — AB (ref 0.61–1.24)
Chloride: 121 mmol/L — ABNORMAL HIGH (ref 98–111)
GFR calc non Af Amer: 33 mL/min — ABNORMAL LOW (ref >60)
GFR, EST AFRICAN AMERICAN: 38 mL/min — AB (ref >60)
Glucose, Bld: 110 mg/dL — ABNORMAL HIGH (ref 70–99)
Potassium: 3.8 mmol/L (ref 3.5–5.1)
Sodium: 146 mmol/L — ABNORMAL HIGH (ref 135–145)

## 2018-05-31 LAB — CBC
HCT: 23.4 % — ABNORMAL LOW (ref 39.0–52.0)
Hemoglobin: 7.6 g/dL — ABNORMAL LOW (ref 13.0–17.0)
MCH: 31.1 pg (ref 26.0–34.0)
MCHC: 32.5 g/dL (ref 30.0–36.0)
MCV: 95.9 fL (ref 78.0–100.0)
PLATELETS: 139 10*3/uL — AB (ref 150–400)
RBC: 2.44 MIL/uL — AB (ref 4.22–5.81)
RDW: 14.6 % (ref 11.5–15.5)
WBC: 7.9 10*3/uL (ref 4.0–10.5)

## 2018-05-31 LAB — VITAMIN B12: VITAMIN B 12: 159 pg/mL — AB (ref 180–914)

## 2018-05-31 LAB — RETICULOCYTES
RBC.: 2.81 MIL/uL — AB (ref 4.22–5.81)
RETIC COUNT ABSOLUTE: 50.6 10*3/uL (ref 19.0–186.0)
Retic Ct Pct: 1.8 % (ref 0.4–3.1)

## 2018-05-31 LAB — FERRITIN: Ferritin: 31 ng/mL (ref 24–336)

## 2018-05-31 MED ORDER — AMLODIPINE BESYLATE 10 MG PO TABS: 5.0000 mg | ORAL_TABLET | Freq: Every day | ORAL | 0 refills | Status: AC

## 2018-05-31 MED ORDER — FERROUS SULFATE 325 (65 FE) MG PO TABS
325.0000 mg | ORAL_TABLET | Freq: Every day | ORAL | Status: DC
  Administered 2018-05-31: 325 mg via ORAL
  Filled 2018-05-31: qty 1

## 2018-05-31 MED ORDER — PANTOPRAZOLE SODIUM 40 MG PO TBEC
40.0000 mg | DELAYED_RELEASE_TABLET | Freq: Two times a day (BID) | ORAL | Status: DC
  Administered 2018-05-31: 40 mg via ORAL
  Filled 2018-05-31: qty 1

## 2018-05-31 MED ORDER — PANTOPRAZOLE SODIUM 40 MG PO TBEC: 40.0000 mg | DELAYED_RELEASE_TABLET | Freq: Two times a day (BID) | ORAL | 0 refills | Status: AC

## 2018-05-31 MED ORDER — ALLOPURINOL 100 MG PO TABS: 100.0000 mg | ORAL_TABLET | Freq: Every day | ORAL | Status: DC

## 2018-05-31 MED ORDER — FERROUS SULFATE 325 (65 FE) MG PO TABS: 325.0000 mg | ORAL_TABLET | Freq: Every day | ORAL | 0 refills | Status: DC

## 2018-05-31 NOTE — Discharge Instructions (Signed)
Gastrointestinal Bleeding Gastrointestinal bleeding is bleeding somewhere along the path food travels through the body (digestive tract). This path is anywhere between the mouth and the opening of the butt (anus). You may have blood in your poop (stools) or have black poop. If you throw up (vomit), there may be blood in it. This condition can be mild, serious, or even life-threatening. If you have a lot of bleeding, you may need to stay in the hospital. Follow these instructions at home:  Take over-the-counter and prescription medicines only as told by your doctor.  Eat foods that have a lot of fiber in them. These foods include whole grains, fruits, and vegetables. You can also try eating 1-3 prunes each day.  Drink enough fluid to keep your pee (urine) clear or pale yellow.  Keep all follow-up visits as told by your doctor. This is important. Contact a doctor if:  Your symptoms do not get better. Get help right away if:  Your bleeding gets worse.  You feel dizzy or you pass out (faint).  You feel weak.  You have very bad cramps in your back or belly (abdomen).  You pass large clumps of blood (clots) in your poop.  Your symptoms are getting worse. This information is not intended to replace advice given to you by your health care provider. Make sure you discuss any questions you have with your health care provider. Document Released: 06/29/2008 Document Revised: 02/26/2016 Document Reviewed: 03/10/2015 Elsevier Interactive Patient Education  2018 Elsevier Inc[employer]   Upper Gastrointestinal Bleeding Upper gastrointestinal (GI) bleeding is bleeding from the swallowing tube (esophagus), stomach, or the first part of the small intestine (duodenum). If you have upper GI bleeding, you may vomit blood or have bloody or black stools. Bleeding can range from mild to serious or even life-threatening. If there is a lot of bleeding, you may need to stay in the hospital. What are the  causes? This condition may be caused by:  Ulcer disease of the stomach (peptic ulcer) or duodenum. This is the most common cause of GI bleeding.  Inflammation, irritation, or swelling of the esophagus (esophagitis).  A tear in the esophagus.  Cancer of the esophagus, stomach, or duodenum.  An abnormal or weakened blood vessel in one of the upper GI structures.  A bleeding disorder that impairs the formation of blood clots and causes easy bleeding (coagulopathy).  What increases the risk? The following factors may make you more likely to develop this condition:  Being older than 82 years of age.  Being male.  Having another long-term disease, especially liver or kidney disease.  Having a stomach infection caused by Helicobacter pylori bacteria.  Having frequent or severe vomiting.  Abusing alcohol.  Taking certain medicines for a long time, such as: ? NSAIDs. ? Anticoagulants.  What are the signs or symptoms? Symptoms of this condition include:  Vomiting blood.  Black or maroon-colored stools.  Bloody stools.  Weakness or dizziness.  Heartburn.  Abdominal pain.  Difficulty swallowing.  Weight loss.  Yellow eyes or skin (jaundice).  Racing heartbeat.  How is this diagnosed? This condition may be diagnosed based on:  Your symptoms and medical history.  A physical exam. During the exam, your health care provider will check for signs of blood loss, such as low blood pressure and a rapid pulse.  Tests, such as: ? Blood tests to measure your blood cell count and to check for other signs of blood loss and clotting ability. ? Blood tests to  check your liver and kidney function. °? A chest X-ray to look for a tear in the esophagus. °? Endoscopy. In this procedure, a flexible scope is put down your esophagus and into your stomach or duodenum to look for the source of bleeding. °? Angiogram. This may be done if the source of bleeding is not found during  endoscopy. For an angiogram, X-rays are taken after a dye is injected into your bloodstream. °? Nasogastric tube insertion. This is a tube passed through your nose and down into your stomach. It may be connected to a source of gentle suction to see if any blood comes out. ° °How is this treated? °Treatment for this condition depends on the cause of the bleeding. Active bleeding is treated at the hospital. Treatment may include: °· Getting fluids through an IV tube inserted into one of your veins. °· Getting blood through an IV tube (blood transfusion). °· Getting high doses of medicine through the IV to lower stomach acid. This may be done to treat ulcer disease. °· Having endoscopy to treat an area of bleeding with high heat (coagulation), injections, or surgical clips. °· Having a procedure that involves first doing an angiogram and then blocking blood flow to the bleeding site (embolization). °· Stopping or changing some of your regular medicines for a certain amount of time. °· Having other surgical procedures if initial treatments do not control bleeding. ° °Follow these instructions at home: °· Take over-the-counter and prescription medicines only as told by your health care provider. You may need to avoid NSAIDs or other medicines that increase bleeding. °· Do not drink alcohol. °· Drink enough fluid to keep your urine clear or pale yellow. °· Follow instructions from your health care provider about eating or drinking restrictions. °· Return to your normal activities as told by your health care provider. Ask your health care provider what activities are safe for you. °· Do not use any tobacco products, such as cigarettes, chewing tobacco, and e-cigarettes. If you need help quitting, ask your health care provider. °· Keep all follow-up visits as told by your health care provider. This is important. °Contact a health care provider if: °· You have abdominal pain or heartburn. °· You have unexplained weight  loss. °· You have trouble swallowing. °· You have frequent vomiting. °· You develop jaundice. °· You feel weak or dizzy. °· You need help to stop smoking or drinking alcohol. °Get help right away if: °· You have vomiting with blood. °· You have blood in your stools. °· You have severe cramps in your back or abdomen. °· Your symptoms of upper GI bleeding come back after treatment. °This information is not intended to replace advice given to you by your health care provider. Make sure you discuss any questions you have with your health care provider. °Document Released: 02/05/2016 Document Revised: 05/23/2016 Document Reviewed: 02/05/2016 °Elsevier Interactive Patient Education © 2018 Elsevier Inc. ° °

## 2018-05-31 NOTE — Progress Notes (Signed)
[facility] Progress Note  Derek Stephens 84 y.o. Jan 14, 1930  CC:  Upper GI bleed   Subjective:  feeling better now. Black tarry stools resolved, now having  dark stools. Only 1 bowel movement today.denied abdominal pain, nausea vomiting.  ROS : negative for chest pain and shortness of breath.   Objective: Vital signs in last 24 hours: Vitals:   05/31/18 0610 05/31/18 0719  BP: 137/78   Pulse: 74   Resp: 13   Temp:  98.7 F (37.1 C)  SpO2: 98%     Physical Exam:  General:  Alert, cooperative, no distress, appears stated age  Head:  Normocephalic, without obvious abnormality, atraumatic  Eyes:  , EOM's intact,   Lungs:   Clear to auscultation bilaterally, respirations unlabored  Heart:  Regular rate and rhythm, S1, S2 normal  Abdomen:   Soft, non-tender, bowel sounds active all four quadrants,  no masses,   Extremities: Extremities normal, atraumatic, no  edema       Lab Results: Recent Labs    05/30/18 0131 05/30/18 0141 05/31/18 0352  NA 144 143 146*  K 4.2 4.1 3.8  CL 114* 113* 121*  CO2 22  --  18*  GLUCOSE 145* 137* 110*  BUN 52* 49* 36*  CREATININE 1.85* 1.90* 1.79*  CALCIUM 8.6*  --  8.3*   Recent Labs    05/30/18 0131  AST 14*  ALT 9  ALKPHOS 43  BILITOT 0.7  PROT 5.9*  ALBUMIN 2.7*   Recent Labs    05/30/18 1352 05/31/18 0352  WBC 11.5* 7.9  HGB 8.4* 7.6*  HCT 26.4* 23.4*  MCV 96.4 95.9  PLT 158 139*   Recent Labs    05/30/18 0131  LABPROT 15.5*  INR 1.24      Assessment/Plan: - upper GI bleed. EGD yesterday showed diffuse gastritis, multiple small gastric and duodenal ulcers. Biopsies pending. There was no evidence of active bleeding during EGD yesterday. - Acute blood loss anemia. Mild drop in hemoglobin today could be dilutional   Recommendations ------------------------ - Recheck H&H in the afternoon.  If Hemoglobin stable, okay to discharge from GI standpoint - continue IV twice a day PPI while in the  hospital, he needs to be on oral twice a day PPI at least for 8 weeks followed by once a day PPI. Avoid NSAIDs. - Follow biopsy results. - Follow-up in GI clinic in 6-8 weeks. - GI will sign off. Call us back if needed  Kathi DerParag Kasen Sako MD, FACP 05/31/2018, 9:11 AM  Contact #  [phone]

## 2018-05-31 NOTE — Progress Notes (Signed)
Nsg Discharge Note  Admit Date:  05/30/2018 Discharge date: 05/31/2018   Eleazar T Jennette Kettleeal to be D/C'd Home per MD order.  AVS completed.  Copy for chart, and copy for patient signed, and dated. Patient/caregiver able to verbalize understanding.  Discharge Medication: Allergies as of 05/31/2018   No Known Allergies     Medication List    STOP taking these medications   aspirin 81 MG chewable tablet   tamsulosin 0.4 MG Caps capsule Commonly known as:  FLOMAX     TAKE these medications   allopurinol 100 MG tablet Commonly known as:  ZYLOPRIM Take 100 mg by mouth daily with breakfast.   amLODipine 10 MG tablet Commonly known as:  NORVASC Take 0.5 tablets (5 mg total) by mouth daily. What changed:  how much to take   ferrous sulfate 325 (65 FE) MG tablet Take 1 tablet (325 mg total) by mouth daily with breakfast.   pantoprazole 40 MG tablet Commonly known as:  PROTONIX Take 1 tablet (40 mg total) by mouth 2 (two) times daily.   TURMERIC PO Take 1 tablet by mouth daily.   VITAMIN D PO Take 1 tablet by mouth daily.       Discharge Assessment: Vitals:   05/31/18 0719 05/31/18 1010  BP:  130/67  Pulse:  77  Resp:  13  Temp: 98.7 F (37.1 C) 98.2 F (36.8 C)  SpO2:  98%   Skin clean, dry and intact without evidence of skin break down, no evidence of skin tears noted. IV catheter discontinued intact. Site without signs and symptoms of complications - no redness or edema noted at insertion site, patient denies c/o pain - only slight tenderness at site.  Dressing with slight pressure applied.  D/c Instructions-Education: Discharge instructions given to patient/family with verbalized understanding. D/c education completed with patient/family including follow up instructions, medication list, d/c activities limitations if indicated, with other d/c instructions as indicated by MD - patient able to verbalize understanding, all questions fully answered. Patient instructed to  return to ED, call 911, or call MD for any changes in condition.  Patient escorted via WC, and D/C home via private auto.  [place] Romayne Ticas, RN 05/31/2018 6:53 PM

## 2018-05-31 NOTE — Evaluation (Signed)
Physical Therapy Evaluation Patient Details Name: Derek Stephens MRN: [id number] DOB: Apr 21, 1930 Today's Date: 05/31/2018   History of Present Illness  Pt is an 84 y/o male admitted secondary to bloody emesis. Pt is s/p EGD and found to have an upper GIB. PMH including but not limited to  HTN, CKD, BPH, and gout.     Clinical Impression  Pt presented sitting OOB in recliner chair, awake and willing to participate in therapy session. Pt's spouse present throughout session as well. Prior to admission, pt reported that he was independent with all functional mobility and ADLs. Pt currently able to perform transfers with supervision and ambulate in hallway with min guard for safety (pushing vitals monitor). Pt was asymptomatic throughout. PT will continue to follow acutely to progress mobility as tolerated.  BP sitting at beginning of session = 138/73 BP standing = 129/78 BP sitting after activity = 157/79 (RN aware)    Follow Up Recommendations No PT follow up    Equipment Recommendations  None recommended by PT    Recommendations for Other Services       Precautions / Restrictions Precautions Precautions: Fall Restrictions Weight Bearing Restrictions: No      Mobility  Bed Mobility   General bed mobility comments: pt sitting OOB in recliner chair upon arrival  Transfers Overall transfer level: Needs assistance Equipment used: None Transfers: Sit to/from Stand Sit to Stand: Supervision   General transfer comment: supervision for safety  Ambulation/Gait Ambulation/Gait assistance: Min guard Gait Distance (Feet): 100 Feet Assistive device: (pushed vitals monitor) Gait Pattern/deviations: Step-through pattern Gait velocity: decreased Gait velocity interpretation: 1.31 - 2.62 ft/sec, indicative of limited community ambulator General Gait Details: pt steady while pushing vitals machine (PT managed IV pole), all VSS throughout  Stairs   Wheelchair Mobility     Modified Rankin (Stroke Patients Only)     Balance Overall balance assessment: Needs assistance Sitting-balance support: Feet supported Sitting balance-Leahy Scale: Good     Standing balance support: During functional activity;No upper extremity supported Standing balance-Leahy Scale: Fair        Pertinent Vitals/Pain Pain Assessment: No/denies pain    Home Living Family/patient expects to be discharged to:: Private residence Living Arrangements: Spouse/significant other Available Help at Discharge: Family;Available 24 hours/day Type of Home: House Home Access: Level entry     Home Layout: Two level;Bed/bath upstairs Home Equipment: None      Prior Function Level of Independence: Independent        Hand Dominance        Extremity/Trunk Assessment   Upper Extremity Assessment Upper Extremity Assessment: Overall WFL for tasks assessed    Lower Extremity Assessment Lower Extremity Assessment: Overall WFL for tasks assessed       Communication   Communication: No difficulties  Cognition Arousal/Alertness: Awake/alert Behavior During Therapy: WFL for tasks assessed/performed Overall Cognitive Status: Within Functional Limits for tasks assessed        General Comments      Exercises     Assessment/Plan    PT Assessment Patient needs continued PT services  PT Problem List Decreased balance;Decreased mobility;Decreased coordination       PT Treatment Interventions DME instruction;Gait training;Stair training;Functional mobility training;Therapeutic activities;Therapeutic exercise;Balance training;Neuromuscular re-education;Patient/family education    PT Goals (Current goals can be found in the Care Plan section)  Acute Rehab PT Goals Patient Stated Goal: return home ASAP PT Goal Formulation: With patient Time For Goal Achievement: 06/14/18 Potential to Achieve Goals: Good    Frequency Min  3X/week   Barriers to discharge         Co-evaluation               AM-PAC PT "6 Clicks" Daily Activity  Outcome Measure Difficulty turning over in bed (including adjusting bedclothes, sheets and blankets)?: None Difficulty moving from lying on back to sitting on the side of the bed? : None Difficulty sitting down on and standing up from a chair with arms (e.g., wheelchair, bedside commode, etc,.)?: A Little Help needed moving to and from a bed to chair (including a wheelchair)?: None Help needed walking in hospital room?: None Help needed climbing 3-5 steps with a railing? : A Little 6 Click Score: 22    End of Session   Activity Tolerance: Patient tolerated treatment well Patient left: in chair;with call bell/phone within reach;with family/visitor present Nurse Communication: Mobility status PT Visit Diagnosis: Other abnormalities of gait and mobility (R26.89)    Time: 1251-1306 PT Time Calculation (min) (ACUTE ONLY): 15 min   Charges:   PT Evaluation $PT Eval Low Complexity: 1 Low          Derek Stephens, PT, [place]PT [phone]   Derek Stephens 05/31/2018, 3:36 PM

## 2018-05-31 NOTE — Progress Notes (Signed)
PROGRESS NOTE  Derek Stephens:[id number] DOB: [birth year] DOA: 05/30/2018 PCP: Renford Dills, MD  HPI/Recap of past 24 hours:  Derek Stephens is a 84 y.o. male with medical history significant of HTN, CKD, BPH, and gout; who presents with complaints of vomiting up blood starting around 11:30 PM last night.  Patient had been having some mild constipation and notes taking a laxative the night before.  Thereafter he had had a couple of bowel movements the following day.  He does not regularly take NSAIDs, but reported taking 2 Tylenol PM last night to try and sleep before going to bed sometime around 8 pm.   Patient reports waking up  around 11:30 pm and having multiple episodes of emesis.  Emesis contained bright red blood with clots.  Associated symptoms included feeling lightheaded, melena, diaphoresis, and nonproductive cough. He also takes a daily aspirin, but denies any other blood thinners.  Denies having any abdominal pain, chest pain, shortness of breath, or dysuria.  En route with EMS he was noted to have a syncopal event with difficulty palpate pulse.  However, patient was reported to have regained consciousness with normal mentation shortly thereafter.  In the ED positive FOBT  05/31/2018: Patient examined at bedside.  Reports dark stools this morning.  GI consulted and following EGD done on 05/30/2018 revealed gastritis, gastric ulcers and duodenal ulcers.  On IV Protonix twice daily.   Assessment/Plan: Principal Problem:   Upper GI bleed Active Problems:   Acute blood loss anemia   Essential hypertension   History of gout   Acute blood loss anemia secondary to upper GI bleed Post EGD done on 05/30/2018 with findings significant for gastritis, gastric ulcers, duodenal ulcers Continue PPI twice daily Avoid NSAIDs Iron studies pending Last hemoglobin 7.4 PT to evaluate If symptomatic we will transfuse 1 unit PRBC Repeat H&H this afternoon Obtain CBC in the  morning  Acute upper GI bleed Management as stated above GI following  Hypertension Stable Continue amlodipine  BPH Stable Continue to monitor urine output Continue tamsulosin  Gout No acute flares Continue allopurinol  CKD 3 Creatinine on presentation 1.9 Baseline creatinine 1.7 currently at baseline GFR 38 Avoid nephrotoxic agents/dehydration/hypotension Repeat BMP in the morning   Code Status: Full code  Family Communication: None at bedside  Disposition Plan: To home possibly tomorrow 06/01/2018   Consultants:  GI  Procedures:  EGD  Antimicrobials:  None  DVT prophylaxis: SCDs   Objective: Vitals:   05/31/18 0206 05/31/18 0420 05/31/18 0610 05/31/18 0719  BP: (!) 157/86  137/78   Pulse: 75  74   Resp: 18  13   Temp:  98.7 F (37.1 C)  98.7 F (37.1 C)  TempSrc:  Oral  Oral  SpO2: 96%  98%   Weight:      Height:        Intake/Output Summary (Last 24 hours) at 05/31/2018 1220 Last data filed at 05/31/2018 1045 Gross per 24 hour  Intake 1074.53 ml  Output 2600 ml  Net -1525.47 ml   Filed Weights   05/30/18 0431  Weight: 86.8 kg    Exam:  . General: 84 y.o. year-old male well developed well nourished in no acute distress.  Alert and oriented x3. . Cardiovascular: Regular rate and rhythm with no rubs or gallops.  No thyromegaly or JVD noted.   Marland Kitchen Respiratory: Clear to auscultation with no wheezes or rales. Good inspiratory effort. . Abdomen: Soft nontender nondistended with normal bowel sounds x4  quadrants. . Musculoskeletal: No lower extremity edema. 2/4 pulses in all 4 extremities. . Skin: No ulcerative lesions noted or rashes, . Psychiatry: Mood is appropriate for condition and setting   Data Reviewed: CBC: Recent Labs  Lab 05/30/18 0131 05/30/18 0141 05/30/18 0537 05/30/18 0904 05/30/18 1352 05/31/18 0352  WBC 7.9  --   --  10.9* 11.5* 7.9  HGB 10.0* 8.8* 9.1* 8.9* 8.4* 7.6*  HCT 31.2* 26.0* 27.5* 27.1* 26.4* 23.4*   MCV 96.0  --   --  95.4 96.4 95.9  PLT 175  --   --  148* 158 139*   Basic Metabolic Panel: Recent Labs  Lab 05/30/18 0131 05/30/18 0141 05/31/18 0352  NA 144 143 146*  K 4.2 4.1 3.8  CL 114* 113* 121*  CO2 22  --  18*  GLUCOSE 145* 137* 110*  BUN 52* 49* 36*  CREATININE 1.85* 1.90* 1.79*  CALCIUM 8.6*  --  8.3*   GFR: Estimated Creatinine Clearance: 32.5 mL/min (A) (by C-G formula based on SCr of 1.79 mg/dL (H)). Liver Function Tests: Recent Labs  Lab 05/30/18 0131  AST 14*  ALT 9  ALKPHOS 43  BILITOT 0.7  PROT 5.9*  ALBUMIN 2.7*   No results for input(s): LIPASE, AMYLASE in the last 168 hours. No results for input(s): AMMONIA in the last 168 hours. Coagulation Profile: Recent Labs  Lab 05/30/18 0131  INR 1.24   Cardiac Enzymes: No results for input(s): CKTOTAL, CKMB, CKMBINDEX, TROPONINI in the last 168 hours. BNP (last 3 results) No results for input(s): PROBNP in the last 8760 hours. HbA1C: No results for input(s): HGBA1C in the last 72 hours. CBG: No results for input(s): GLUCAP in the last 168 hours. Lipid Profile: No results for input(s): CHOL, HDL, LDLCALC, TRIG, CHOLHDL, LDLDIRECT in the last 72 hours. Thyroid Function Tests: No results for input(s): TSH, T4TOTAL, FREET4, T3FREE, THYROIDAB in the last 72 hours. Anemia Panel: No results for input(s): VITAMINB12, FOLATE, FERRITIN, TIBC, IRON, RETICCTPCT in the last 72 hours. Urine analysis: No results found for: COLORURINE, APPEARANCEUR, LABSPEC, PHURINE, GLUCOSEU, HGBUR, BILIRUBINUR, KETONESUR, PROTEINUR, UROBILINOGEN, NITRITE, LEUKOCYTESUR Sepsis Labs: @LABRCNTIP (procalcitonin:4,lacticidven:4)  ) Recent Results (from the past 240 hour(s))  MRSA PCR Screening     Status: None   Collection Time: 05/30/18  4:40 AM  Result Value Ref Range Status   MRSA by PCR NEGATIVE NEGATIVE Final    Comment:        The GeneXpert MRSA Assay (FDA approved for NASAL specimens only), is one component of  a comprehensive MRSA colonization surveillance program. It is not intended to diagnose MRSA infection nor to guide or monitor treatment for MRSA infections. Performed at [facility], [address] [place], [place] [id number]       Studies: No results found.  Scheduled Meds: . pantoprazole (PROTONIX) IV  40 mg Intravenous Q12H  . sodium chloride flush  3 mL Intravenous Q12H    Continuous Infusions: . sodium chloride 75 mL/hr at 05/31/18 1028     LOS: 0 days     Darlin Droparole N Tzvi Economou, MD [facility] Pager [phone]  If 7PM-7AM, please contact night-coverage [email or web address] Password [email or web address] 05/31/2018, 12:20 PM

## 2018-05-31 NOTE — Progress Notes (Signed)
Ambulated patient in the hallway and was able to get to the nurses station.  Patient became slightly short of breath and we returned to the room.  Patient currently up in the chair

## 2018-05-31 NOTE — Discharge Summary (Signed)
Discharge Summary  Derek Stephens ZOX:[id number] DOB: 02/11/1930  PCP: Renford DillsPolite, Ronald, MD  Admit date: 05/30/2018 Discharge date: 05/31/2018  Time spent: 25 minutes  Recommendations for Outpatient Follow-up:  1. Follow up with PCP within a week 2. Follow up with GI within 8 weeks 3. Avoid NSAIDs such as Ibuprofen, naproxen 4. Stay hydrated due to positive orthostatic vital signs 5. Hold tamsulosin due to positive orthostatic vital signs 6. Reduced dose of amlodipine to avoid hypotension  Discharge Diagnoses:  Active Hospital Problems   Diagnosis Date Noted  . Upper GI bleed 05/30/2018  . Acute blood loss anemia 05/30/2018  . Essential hypertension 05/30/2018  . History of gout 05/30/2018    Resolved Hospital Problems  No resolved problems to display.    Discharge Condition: Stable  Diet recommendation: Resume previous diet  Vitals:   05/31/18 0719 05/31/18 1010  BP:  130/67  Pulse:  77  Resp:  13  Temp: 98.7 F (37.1 C) 98.2 F (36.8 C)  SpO2:  98%    History of present illness:  Derek Stephens a 84 y.o.malewith medical history significant ofHTN, CKD, BPH, and gout;who presents with complaints of vomitingupblood starting around 11:30 PM last night. Patient had been having some mildconstipationand notes taking a laxative the night before. Thereafter he had had a couple of bowel movements the following day. He does not regularly take NSAIDs, but reported taking 2 Tylenol PM last night to try and sleep beforegoing tobed sometime around 8 pm. Patient reports waking up around 11:30pmand having multiple episodes of emesis. Emesis contained bright red blood with clots. Associated symptoms included feeling lightheaded, melena, diaphoresis, andnonproductive cough. He also takes a daily aspirin,but denies any other blood thinners.Denies having any abdominal pain, chest pain, shortness of breath,ordysuria.  En route with EMS he was noted to have a  syncopal event with difficulty palpate pulse. However, patient was reported to haveregainedconsciousness with normal mentation shortly thereafter.  In the ED positive FOBT.  05/31/2018: Patient examined at bedside.  Reports 1 episode of dark stool this morning.  GI consulted and following EGD done on 05/30/2018 revealed gastritis, gastric ulcers and duodenal ulcers.  On IV Protonix twice daily.  No repeated stool this afternoon.  Repeated H&H this afternoon revealed hemoglobin of 8.9.  Ambulated with physical therapist with no recommendations for outpatient PT.  Orthostatic vital signs positive.  Continue to hold tamsulosin.  Avoid dehydration.  On the day of discharge, the patient was hemodynamically stable.  He will need to follow-up with his PCP within a week, repeat CBC on 06/02/18 and f/u with GI within 8 weeks.  Continue to take PPI twice daily and avoid NSAIDs.  Hospital Course:  Principal Problem:   Upper GI bleed Active Problems:   Acute blood loss anemia   Essential hypertension   History of gout  Acute blood loss anemia secondary to upper GI bleed Post EGD done on 05/30/2018 with findings significant for gastritis, gastric ulcers, duodenal ulcers Continue PPI po protonix twice daily Avoid NSAIDs Iron studies significant for iron deficiency Ferrous sulfate started 325 mg daily Last hemoglobin 7.4 --->8.9 PT evaluated- no outpatient PT recommended Repeat H&H this afternoon with hemoglobin 8.9 Follow-up with PCP within a week, repeat CBC on Friday 06/02/18.  Acute upper GI bleed secondary to gastritis/gastric ulcers and duodenal ulcers Biopsies pending F/u with GI within 8 weeks Management as stated above  Hypertension Stable Continue amlodipine 5 mg daily instead of 10 mg daily Follow-up with your PCP  within a week  BPH Stable Hold tamsulosin due to positive orthostatic vital signs Monitor urine output  Orthostatic hypotension Positive orthostatic vital  signs Avoid dehydration Received IV fluid NS during this admission Continue to hold tamsulosin Stay hydrated Follow-up with PCP  Gout No acute flares Continue allopurinol  CKD 3 Creatinine on presentation 1.9 Baseline creatinine 1.7 currently at baseline GFR 38 Avoid nephrotoxic agents/dehydration/hypotension Follow-up with PCP   Procedures:  EGD on 05/30/2018  Consultations:  GI  Discharge Exam: BP 130/67 (BP Location: Right Arm)   Pulse 77   Temp 98.2 F (36.8 C) (Oral)   Resp 13   Ht 6' (1.829 m)   Wt 86.8 kg   SpO2 98%   BMI 25.95 kg/m  . General: 84 y.o. year-old male well developed well nourished in no acute distress.  Alert and oriented x3. . Cardiovascular: Regular rate and rhythm with no rubs or gallops.  No thyromegaly or JVD noted.   Marland Kitchen Respiratory: Clear to auscultation with no wheezes or rales. Good inspiratory effort. . Abdomen: Soft nontender nondistended with normal bowel sounds x4 quadrants. . Musculoskeletal: No lower extremity edema. 2/4 pulses in all 4 extremities. . Skin: No ulcerative lesions noted or rashes, . Psychiatry: Mood is appropriate for condition and setting  Discharge Instructions You were cared for by a hospitalist during your hospital stay. If you have any questions about your discharge medications or the care you received while you were in the hospital after you are discharged, you can call the unit and asked to speak with the hospitalist on call if the hospitalist that took care of you is not available. Once you are discharged, your primary care physician will handle any further medical issues. Please note that NO REFILLS for any discharge medications will be authorized once you are discharged, as it is imperative that you return to your primary care physician (or establish a relationship with a primary care physician if you do not have one) for your aftercare needs so that they can reassess your need for medications and monitor  your lab values.   Allergies as of 05/31/2018   No Known Allergies     Medication List    STOP taking these medications   aspirin 81 MG chewable tablet   tamsulosin 0.4 MG Caps capsule Commonly known as:  FLOMAX     TAKE these medications   allopurinol 100 MG tablet Commonly known as:  ZYLOPRIM Take 100 mg by mouth daily with breakfast.   amLODipine 10 MG tablet Commonly known as:  NORVASC Take 0.5 tablets (5 mg total) by mouth daily. What changed:  how much to take   ferrous sulfate 325 (65 FE) MG tablet Take 1 tablet (325 mg total) by mouth daily with breakfast.   pantoprazole 40 MG tablet Commonly known as:  PROTONIX Take 1 tablet (40 mg total) by mouth 2 (two) times daily.   TURMERIC PO Take 1 tablet by mouth daily.   VITAMIN D PO Take 1 tablet by mouth daily.      No Known Allergies Follow-up Information    Brahmbhatt, Parag, MD. Schedule an appointment as soon as possible for a visit in 6 week(s).   Specialty:  Gastroenterology Why:  follow-up for upper GI bleed. Contact information: [address] [place] [address] [phone]        Renford Dills, MD. Call in 1 day(s).   Specialty:  Internal Medicine Why:  Please call for an appointment. Contact information:  [address] [place] [address] [phone]            The results of significant diagnostics from this hospitalization (including imaging, microbiology, ancillary and laboratory) are listed below for reference.    Significant Diagnostic Studies: Dg Chest Port 1 View  Result Date: 05/30/2018 CLINICAL DATA:  84 year old male with cough. EXAM: PORTABLE CHEST 1 VIEW COMPARISON:  Chest radiograph dated 03/16/2012 FINDINGS: Bibasilar linear atelectasis/scarring. No focal consolidation, pleural effusion, or pneumothorax. The cardiac silhouette is within normal limits. No acute osseous pathology. IMPRESSION: No active disease. Electronically Signed   By:  Elgie Collard M.D.   On: 05/30/2018 04:31    Microbiology: Recent Results (from the past 240 hour(s))  MRSA PCR Screening     Status: None   Collection Time: 05/30/18  4:40 AM  Result Value Ref Range Status   MRSA by PCR NEGATIVE NEGATIVE Final    Comment:        The GeneXpert MRSA Assay (FDA approved for NASAL specimens only), is one component of a comprehensive MRSA colonization surveillance program. It is not intended to diagnose MRSA infection nor to guide or monitor treatment for MRSA infections. Performed at [facility], [address] [place], [place] [address]      Labs: Basic Metabolic Panel: Recent Labs  Lab 05/30/18 0131 05/30/18 0141 05/31/18 0352  NA 144 143 146*  K 4.2 4.1 3.8  CL 114* 113* 121*  CO2 22  --  18*  GLUCOSE 145* 137* 110*  BUN 52* 49* 36*  CREATININE 1.85* 1.90* 1.79*  CALCIUM 8.6*  --  8.3*   Liver Function Tests: Recent Labs  Lab 05/30/18 0131  AST 14*  ALT 9  ALKPHOS 43  BILITOT 0.7  PROT 5.9*  ALBUMIN 2.7*   No results for input(s): LIPASE, AMYLASE in the last 168 hours. No results for input(s): AMMONIA in the last 168 hours. CBC: Recent Labs  Lab 05/30/18 0131  05/30/18 0537 05/30/18 0904 05/30/18 1352 05/31/18 0352 05/31/18 1240  WBC 7.9  --   --  10.9* 11.5* 7.9  --   HGB 10.0*   < > 9.1* 8.9* 8.4* 7.6* 8.9*  HCT 31.2*   < > 27.5* 27.1* 26.4* 23.4* 27.2*  MCV 96.0  --   --  95.4 96.4 95.9  --   PLT 175  --   --  148* 158 139*  --    < > = values in this interval not displayed.   Cardiac Enzymes: No results for input(s): CKTOTAL, CKMB, CKMBINDEX, TROPONINI in the last 168 hours. BNP: BNP (last 3 results) No results for input(s): BNP in the last 8760 hours.  ProBNP (last 3 results) No results for input(s): PROBNP in the last 8760 hours.  CBG: No results for input(s): GLUCAP in the last 168 hours.     Signed:  Darlin Drop, MD [facility] 05/31/2018, 4:52 PM

## 2018-05-31 NOTE — Progress Notes (Signed)
Reviewed with pt and wife the importance of taking his protonix and iron pills. Also informed pt to cut norvasc dose in half, stop taking flomax, and follow up with pcp in 1 week. Pt and wife confirmed understanding.

## 2018-06-01 LAB — FOLATE RBC
FOLATE, HEMOLYSATE: 262.2 ng/mL
FOLATE, RBC: 997 ng/mL (ref >498)
Hematocrit: 26.3 % — ABNORMAL LOW (ref 37.5–51.0)

## 2018-06-08 DIAGNOSIS — K279 Peptic ulcer, site unspecified, unspecified as acute or chronic, without hemorrhage or perforation: Secondary | ICD-10-CM | POA: Diagnosis not present

## 2018-06-08 DIAGNOSIS — K922 Gastrointestinal hemorrhage, unspecified: Secondary | ICD-10-CM | POA: Diagnosis not present

## 2018-06-08 DIAGNOSIS — I10 Essential (primary) hypertension: Secondary | ICD-10-CM | POA: Diagnosis not present

## 2018-06-12 DIAGNOSIS — K269 Duodenal ulcer, unspecified as acute or chronic, without hemorrhage or perforation: Secondary | ICD-10-CM | POA: Diagnosis not present

## 2018-06-12 DIAGNOSIS — A048 Other specified bacterial intestinal infections: Secondary | ICD-10-CM | POA: Diagnosis not present

## 2018-06-12 DIAGNOSIS — K259 Gastric ulcer, unspecified as acute or chronic, without hemorrhage or perforation: Secondary | ICD-10-CM | POA: Diagnosis not present

## 2018-06-12 DIAGNOSIS — D649 Anemia, unspecified: Secondary | ICD-10-CM | POA: Diagnosis not present

## 2018-07-11 DIAGNOSIS — Z23 Encounter for immunization: Secondary | ICD-10-CM | POA: Diagnosis not present

## 2018-08-01 DIAGNOSIS — H25013 Cortical age-related cataract, bilateral: Secondary | ICD-10-CM | POA: Diagnosis not present

## 2018-08-01 DIAGNOSIS — H35033 Hypertensive retinopathy, bilateral: Secondary | ICD-10-CM | POA: Diagnosis not present

## 2018-08-01 DIAGNOSIS — H40013 Open angle with borderline findings, low risk, bilateral: Secondary | ICD-10-CM | POA: Diagnosis not present

## 2018-08-01 DIAGNOSIS — I10 Essential (primary) hypertension: Secondary | ICD-10-CM | POA: Diagnosis not present

## 2018-08-01 DIAGNOSIS — N183 Chronic kidney disease, stage 3 (moderate): Secondary | ICD-10-CM | POA: Diagnosis not present

## 2018-08-01 DIAGNOSIS — H2513 Age-related nuclear cataract, bilateral: Secondary | ICD-10-CM | POA: Diagnosis not present

## 2018-09-06 DIAGNOSIS — D649 Anemia, unspecified: Secondary | ICD-10-CM | POA: Diagnosis not present

## 2018-09-06 DIAGNOSIS — K279 Peptic ulcer, site unspecified, unspecified as acute or chronic, without hemorrhage or perforation: Secondary | ICD-10-CM | POA: Diagnosis not present

## 2018-09-19 DIAGNOSIS — I10 Essential (primary) hypertension: Secondary | ICD-10-CM | POA: Diagnosis not present

## 2018-09-19 DIAGNOSIS — N183 Chronic kidney disease, stage 3 (moderate): Secondary | ICD-10-CM | POA: Diagnosis not present

## 2018-09-20 DIAGNOSIS — R972 Elevated prostate specific antigen [PSA]: Secondary | ICD-10-CM | POA: Diagnosis not present

## 2018-11-10 DIAGNOSIS — N183 Chronic kidney disease, stage 3 (moderate): Secondary | ICD-10-CM | POA: Diagnosis not present

## 2018-11-10 DIAGNOSIS — Z1389 Encounter for screening for other disorder: Secondary | ICD-10-CM | POA: Diagnosis not present

## 2018-11-10 DIAGNOSIS — Z0000 Encounter for general adult medical examination without abnormal findings: Secondary | ICD-10-CM | POA: Diagnosis not present

## 2018-11-10 DIAGNOSIS — I10 Essential (primary) hypertension: Secondary | ICD-10-CM | POA: Diagnosis not present

## 2018-11-10 DIAGNOSIS — Z23 Encounter for immunization: Secondary | ICD-10-CM | POA: Diagnosis not present

## 2018-12-05 DIAGNOSIS — I10 Essential (primary) hypertension: Secondary | ICD-10-CM | POA: Diagnosis not present

## 2019-01-24 DIAGNOSIS — N183 Chronic kidney disease, stage 3 (moderate): Secondary | ICD-10-CM | POA: Diagnosis not present

## 2019-01-24 DIAGNOSIS — I10 Essential (primary) hypertension: Secondary | ICD-10-CM | POA: Diagnosis not present

## 2019-03-02 DIAGNOSIS — I10 Essential (primary) hypertension: Secondary | ICD-10-CM | POA: Diagnosis not present

## 2019-03-02 DIAGNOSIS — N183 Chronic kidney disease, stage 3 (moderate): Secondary | ICD-10-CM | POA: Diagnosis not present

## 2019-03-05 DIAGNOSIS — I10 Essential (primary) hypertension: Secondary | ICD-10-CM | POA: Diagnosis not present

## 2019-03-05 DIAGNOSIS — N183 Chronic kidney disease, stage 3 (moderate): Secondary | ICD-10-CM | POA: Diagnosis not present

## 2019-03-20 DIAGNOSIS — R972 Elevated prostate specific antigen [PSA]: Secondary | ICD-10-CM | POA: Diagnosis not present

## 2019-03-27 DIAGNOSIS — N403 Nodular prostate with lower urinary tract symptoms: Secondary | ICD-10-CM | POA: Diagnosis not present

## 2019-03-27 DIAGNOSIS — R351 Nocturia: Secondary | ICD-10-CM | POA: Diagnosis not present

## 2019-03-27 DIAGNOSIS — R972 Elevated prostate specific antigen [PSA]: Secondary | ICD-10-CM | POA: Diagnosis not present

## 2019-05-09 DIAGNOSIS — Z8719 Personal history of other diseases of the digestive system: Secondary | ICD-10-CM | POA: Diagnosis not present

## 2019-05-09 DIAGNOSIS — I10 Essential (primary) hypertension: Secondary | ICD-10-CM | POA: Diagnosis not present

## 2019-05-09 DIAGNOSIS — N183 Chronic kidney disease, stage 3 (moderate): Secondary | ICD-10-CM | POA: Diagnosis not present

## 2019-06-20 DIAGNOSIS — N183 Chronic kidney disease, stage 3 (moderate): Secondary | ICD-10-CM | POA: Diagnosis not present

## 2019-06-20 DIAGNOSIS — I10 Essential (primary) hypertension: Secondary | ICD-10-CM | POA: Diagnosis not present

## 2019-07-19 DIAGNOSIS — Z23 Encounter for immunization: Secondary | ICD-10-CM | POA: Diagnosis not present

## 2019-08-06 DIAGNOSIS — H43813 Vitreous degeneration, bilateral: Secondary | ICD-10-CM | POA: Diagnosis not present

## 2019-08-06 DIAGNOSIS — H2513 Age-related nuclear cataract, bilateral: Secondary | ICD-10-CM | POA: Diagnosis not present

## 2019-08-06 DIAGNOSIS — H25013 Cortical age-related cataract, bilateral: Secondary | ICD-10-CM | POA: Diagnosis not present

## 2019-08-06 DIAGNOSIS — H40013 Open angle with borderline findings, low risk, bilateral: Secondary | ICD-10-CM | POA: Diagnosis not present

## 2019-08-06 DIAGNOSIS — H524 Presbyopia: Secondary | ICD-10-CM | POA: Diagnosis not present

## 2019-09-25 DIAGNOSIS — R972 Elevated prostate specific antigen [PSA]: Secondary | ICD-10-CM | POA: Diagnosis not present

## 2019-11-02 ENCOUNTER — Ambulatory Visit: Payer: PPO

## 2019-11-10 ENCOUNTER — Ambulatory Visit: Payer: PPO

## 2019-11-12 DIAGNOSIS — I10 Essential (primary) hypertension: Secondary | ICD-10-CM | POA: Diagnosis not present

## 2019-11-12 DIAGNOSIS — Z1389 Encounter for screening for other disorder: Secondary | ICD-10-CM | POA: Diagnosis not present

## 2019-11-12 DIAGNOSIS — Z0000 Encounter for general adult medical examination without abnormal findings: Secondary | ICD-10-CM | POA: Diagnosis not present

## 2019-11-12 DIAGNOSIS — Z862 Personal history of diseases of the blood and blood-forming organs and certain disorders involving the immune mechanism: Secondary | ICD-10-CM | POA: Diagnosis not present

## 2019-11-12 DIAGNOSIS — N1831 Chronic kidney disease, stage 3a: Secondary | ICD-10-CM | POA: Diagnosis not present

## 2019-11-19 ENCOUNTER — Ambulatory Visit: Payer: PPO | Attending: Internal Medicine

## 2019-11-23 ENCOUNTER — Ambulatory Visit: Payer: PPO

## 2020-03-10 DIAGNOSIS — I10 Essential (primary) hypertension: Secondary | ICD-10-CM | POA: Diagnosis not present

## 2020-03-10 DIAGNOSIS — N183 Chronic kidney disease, stage 3 unspecified: Secondary | ICD-10-CM | POA: Diagnosis not present

## 2020-03-11 DIAGNOSIS — R972 Elevated prostate specific antigen [PSA]: Secondary | ICD-10-CM | POA: Diagnosis not present

## 2020-03-17 DIAGNOSIS — R351 Nocturia: Secondary | ICD-10-CM | POA: Diagnosis not present

## 2020-03-17 DIAGNOSIS — R972 Elevated prostate specific antigen [PSA]: Secondary | ICD-10-CM | POA: Diagnosis not present

## 2020-03-17 DIAGNOSIS — N403 Nodular prostate with lower urinary tract symptoms: Secondary | ICD-10-CM | POA: Diagnosis not present

## 2020-04-10 DIAGNOSIS — N183 Chronic kidney disease, stage 3 unspecified: Secondary | ICD-10-CM | POA: Diagnosis not present

## 2020-04-10 DIAGNOSIS — I10 Essential (primary) hypertension: Secondary | ICD-10-CM | POA: Diagnosis not present

## 2020-04-21 DIAGNOSIS — G4709 Other insomnia: Secondary | ICD-10-CM | POA: Diagnosis not present

## 2020-05-13 DIAGNOSIS — N1831 Chronic kidney disease, stage 3a: Secondary | ICD-10-CM | POA: Diagnosis not present

## 2020-05-13 DIAGNOSIS — F5101 Primary insomnia: Secondary | ICD-10-CM | POA: Diagnosis not present

## 2020-05-13 DIAGNOSIS — H6123 Impacted cerumen, bilateral: Secondary | ICD-10-CM | POA: Diagnosis not present

## 2020-05-13 DIAGNOSIS — H9311 Tinnitus, right ear: Secondary | ICD-10-CM | POA: Diagnosis not present

## 2020-05-13 DIAGNOSIS — F419 Anxiety disorder, unspecified: Secondary | ICD-10-CM | POA: Diagnosis not present

## 2020-05-13 DIAGNOSIS — I10 Essential (primary) hypertension: Secondary | ICD-10-CM | POA: Diagnosis not present

## 2020-06-12 DIAGNOSIS — N183 Chronic kidney disease, stage 3 unspecified: Secondary | ICD-10-CM | POA: Diagnosis not present

## 2020-06-12 DIAGNOSIS — I10 Essential (primary) hypertension: Secondary | ICD-10-CM | POA: Diagnosis not present

## 2020-06-30 DIAGNOSIS — H9313 Tinnitus, bilateral: Secondary | ICD-10-CM | POA: Diagnosis not present

## 2020-06-30 DIAGNOSIS — H6121 Impacted cerumen, right ear: Secondary | ICD-10-CM | POA: Diagnosis not present

## 2020-07-01 DIAGNOSIS — H903 Sensorineural hearing loss, bilateral: Secondary | ICD-10-CM | POA: Diagnosis not present

## 2020-07-29 DIAGNOSIS — I10 Essential (primary) hypertension: Secondary | ICD-10-CM | POA: Diagnosis not present

## 2020-07-29 DIAGNOSIS — N183 Chronic kidney disease, stage 3 unspecified: Secondary | ICD-10-CM | POA: Diagnosis not present

## 2020-07-30 DIAGNOSIS — Z23 Encounter for immunization: Secondary | ICD-10-CM | POA: Diagnosis not present

## 2020-08-06 DIAGNOSIS — H2513 Age-related nuclear cataract, bilateral: Secondary | ICD-10-CM | POA: Diagnosis not present

## 2020-08-06 DIAGNOSIS — H43813 Vitreous degeneration, bilateral: Secondary | ICD-10-CM | POA: Diagnosis not present

## 2020-08-06 DIAGNOSIS — H40013 Open angle with borderline findings, low risk, bilateral: Secondary | ICD-10-CM | POA: Diagnosis not present

## 2020-08-06 DIAGNOSIS — H25013 Cortical age-related cataract, bilateral: Secondary | ICD-10-CM | POA: Diagnosis not present

## 2020-08-06 DIAGNOSIS — H524 Presbyopia: Secondary | ICD-10-CM | POA: Diagnosis not present

## 2020-08-13 DIAGNOSIS — I10 Essential (primary) hypertension: Secondary | ICD-10-CM | POA: Diagnosis not present

## 2020-08-13 DIAGNOSIS — N183 Chronic kidney disease, stage 3 unspecified: Secondary | ICD-10-CM | POA: Diagnosis not present

## 2020-09-04 DIAGNOSIS — F419 Anxiety disorder, unspecified: Secondary | ICD-10-CM | POA: Diagnosis not present

## 2020-09-15 DIAGNOSIS — R972 Elevated prostate specific antigen [PSA]: Secondary | ICD-10-CM | POA: Diagnosis not present

## 2020-09-30 DIAGNOSIS — I10 Essential (primary) hypertension: Secondary | ICD-10-CM | POA: Diagnosis not present

## 2020-09-30 DIAGNOSIS — N183 Chronic kidney disease, stage 3 unspecified: Secondary | ICD-10-CM | POA: Diagnosis not present

## 2020-10-16 DIAGNOSIS — F419 Anxiety disorder, unspecified: Secondary | ICD-10-CM | POA: Diagnosis not present

## 2020-10-16 DIAGNOSIS — H9193 Unspecified hearing loss, bilateral: Secondary | ICD-10-CM | POA: Diagnosis not present

## 2020-10-16 DIAGNOSIS — H9313 Tinnitus, bilateral: Secondary | ICD-10-CM | POA: Diagnosis not present

## 2020-11-17 DIAGNOSIS — I10 Essential (primary) hypertension: Secondary | ICD-10-CM | POA: Diagnosis not present

## 2020-11-17 DIAGNOSIS — R350 Frequency of micturition: Secondary | ICD-10-CM | POA: Diagnosis not present

## 2020-11-17 DIAGNOSIS — Z0000 Encounter for general adult medical examination without abnormal findings: Secondary | ICD-10-CM | POA: Diagnosis not present

## 2020-11-17 DIAGNOSIS — N1831 Chronic kidney disease, stage 3a: Secondary | ICD-10-CM | POA: Diagnosis not present

## 2020-11-17 DIAGNOSIS — Z1389 Encounter for screening for other disorder: Secondary | ICD-10-CM | POA: Diagnosis not present

## 2020-11-17 DIAGNOSIS — F419 Anxiety disorder, unspecified: Secondary | ICD-10-CM | POA: Diagnosis not present

## 2020-12-04 DIAGNOSIS — N419 Inflammatory disease of prostate, unspecified: Secondary | ICD-10-CM | POA: Diagnosis not present

## 2020-12-09 ENCOUNTER — Emergency Department (HOSPITAL_COMMUNITY)
Admission: EM | Admit: 2020-12-09 | Discharge: 2020-12-09 | Disposition: A | Discharge status: Home / Self Care | Payer: PPO | Attending: Emergency Medicine | Admitting: Emergency Medicine

## 2020-12-09 ENCOUNTER — Encounter (HOSPITAL_COMMUNITY): Payer: Self-pay | Admitting: Pharmacy Technician

## 2020-12-09 ENCOUNTER — Emergency Department (HOSPITAL_COMMUNITY): Payer: PPO

## 2020-12-09 DIAGNOSIS — R0789 Other chest pain: Secondary | ICD-10-CM | POA: Insufficient documentation

## 2020-12-09 DIAGNOSIS — I10 Essential (primary) hypertension: Secondary | ICD-10-CM | POA: Diagnosis not present

## 2020-12-09 DIAGNOSIS — R079 Chest pain, unspecified: Secondary | ICD-10-CM | POA: Diagnosis not present

## 2020-12-09 DIAGNOSIS — Z87891 Personal history of nicotine dependence: Secondary | ICD-10-CM | POA: Diagnosis not present

## 2020-12-09 DIAGNOSIS — E041 Nontoxic single thyroid nodule: Secondary | ICD-10-CM | POA: Diagnosis not present

## 2020-12-09 DIAGNOSIS — I774 Celiac artery compression syndrome: Secondary | ICD-10-CM | POA: Diagnosis not present

## 2020-12-09 DIAGNOSIS — N201 Calculus of ureter: Secondary | ICD-10-CM | POA: Diagnosis not present

## 2020-12-09 DIAGNOSIS — K900 Celiac disease: Secondary | ICD-10-CM | POA: Diagnosis not present

## 2020-12-09 DIAGNOSIS — N132 Hydronephrosis with renal and ureteral calculous obstruction: Secondary | ICD-10-CM | POA: Diagnosis not present

## 2020-12-09 DIAGNOSIS — Z79899 Other long term (current) drug therapy: Secondary | ICD-10-CM | POA: Insufficient documentation

## 2020-12-09 DIAGNOSIS — N281 Cyst of kidney, acquired: Secondary | ICD-10-CM | POA: Diagnosis not present

## 2020-12-09 DIAGNOSIS — I771 Stricture of artery: Secondary | ICD-10-CM | POA: Diagnosis not present

## 2020-12-09 DIAGNOSIS — K769 Liver disease, unspecified: Secondary | ICD-10-CM

## 2020-12-09 DIAGNOSIS — N23 Unspecified renal colic: Secondary | ICD-10-CM

## 2020-12-09 DIAGNOSIS — I2510 Atherosclerotic heart disease of native coronary artery without angina pectoris: Secondary | ICD-10-CM | POA: Diagnosis not present

## 2020-12-09 DIAGNOSIS — R9431 Abnormal electrocardiogram [ECG] [EKG]: Secondary | ICD-10-CM | POA: Diagnosis not present

## 2020-12-09 DIAGNOSIS — K7689 Other specified diseases of liver: Secondary | ICD-10-CM | POA: Diagnosis not present

## 2020-12-09 DIAGNOSIS — R109 Unspecified abdominal pain: Secondary | ICD-10-CM | POA: Diagnosis present

## 2020-12-09 LAB — LIPASE, BLOOD: Lipase: 31 U/L (ref 11–51)

## 2020-12-09 LAB — CBC
HCT: 41.4 % (ref 39.0–52.0)
Hemoglobin: 14.4 g/dL (ref 13.0–17.0)
MCH: 31.6 pg (ref 26.0–34.0)
MCHC: 34.8 g/dL (ref 30.0–36.0)
MCV: 91 fL (ref 80.0–100.0)
Platelets: 184 10*3/uL (ref 150–400)
RBC: 4.55 MIL/uL (ref 4.22–5.81)
RDW: 13.9 % (ref 11.5–15.5)
WBC: 5.1 10*3/uL (ref 4.0–10.5)
nRBC: 0 % (ref 0.0–0.2)

## 2020-12-09 LAB — URINALYSIS, ROUTINE W REFLEX MICROSCOPIC
Bilirubin Urine: NEGATIVE
Glucose, UA: NEGATIVE mg/dL
Hgb urine dipstick: NEGATIVE
Ketones, ur: 5 mg/dL — AB
Leukocytes,Ua: NEGATIVE
Nitrite: NEGATIVE
Protein, ur: NEGATIVE mg/dL
Specific Gravity, Urine: 1.017 (ref 1.005–1.030)
pH: 7 (ref 5.0–8.0)

## 2020-12-09 LAB — COMPREHENSIVE METABOLIC PANEL
ALT: 18 U/L (ref 0–44)
AST: 27 U/L (ref 15–41)
Albumin: 3.5 g/dL (ref 3.5–5.0)
Alkaline Phosphatase: 66 U/L (ref 38–126)
Anion gap: 9 (ref 5–15)
BUN: 14 mg/dL (ref 8–23)
CO2: 23 mmol/L (ref 22–32)
Calcium: 9.2 mg/dL (ref 8.9–10.3)
Chloride: 103 mmol/L (ref 98–111)
Creatinine, Ser: 1.7 mg/dL — ABNORMAL HIGH (ref 0.61–1.24)
GFR, Estimated: 38 mL/min — ABNORMAL LOW (ref >60)
Glucose, Bld: 106 mg/dL — ABNORMAL HIGH (ref 70–99)
Potassium: 3.5 mmol/L (ref 3.5–5.1)
Sodium: 135 mmol/L (ref 135–145)
Total Bilirubin: 1.3 mg/dL — ABNORMAL HIGH (ref 0.3–1.2)
Total Protein: 9 g/dL — ABNORMAL HIGH (ref 6.5–8.1)

## 2020-12-09 LAB — TROPONIN I (HIGH SENSITIVITY)
Troponin I (High Sensitivity): 10 ng/L (ref <18)
Troponin I (High Sensitivity): 12 ng/L (ref <18)

## 2020-12-09 MED ORDER — SODIUM CHLORIDE 0.9 % IV BOLUS
500.0000 mL | Freq: Once | INTRAVENOUS | Status: AC
  Administered 2020-12-09: 500 mL via INTRAVENOUS

## 2020-12-09 MED ORDER — OXYCODONE HCL 5 MG PO TABS
5.0000 mg | ORAL_TABLET | Freq: Once | ORAL | Status: AC
  Administered 2020-12-09: 5 mg via ORAL
  Filled 2020-12-09: qty 1

## 2020-12-09 MED ORDER — MORPHINE SULFATE (PF) 4 MG/ML IV SOLN
4.0000 mg | Freq: Once | INTRAVENOUS | Status: AC
  Administered 2020-12-09: 4 mg via INTRAVENOUS
  Filled 2020-12-09: qty 1

## 2020-12-09 MED ORDER — OXYCODONE HCL 5 MG PO TABS: 2.5000 mg | ORAL_TABLET | Freq: Four times a day (QID) | ORAL | 0 refills | Status: AC | PRN

## 2020-12-09 MED ORDER — IOHEXOL 350 MG/ML SOLN
80.0000 mL | Freq: Once | INTRAVENOUS | Status: AC | PRN
  Administered 2020-12-09: 80 mL via INTRAVENOUS

## 2020-12-09 MED ORDER — TAMSULOSIN HCL 0.4 MG PO CAPS: 0.4000 mg | ORAL_CAPSULE | Freq: Every day | ORAL | 0 refills | Status: AC

## 2020-12-09 MED ORDER — KETOROLAC TROMETHAMINE 15 MG/ML IJ SOLN
15.0000 mg | Freq: Once | INTRAMUSCULAR | Status: AC
  Administered 2020-12-09: 15 mg via INTRAVENOUS
  Filled 2020-12-09: qty 1

## 2020-12-09 MED ORDER — ONDANSETRON HCL 4 MG/2ML IJ SOLN
4.0000 mg | Freq: Once | INTRAMUSCULAR | Status: AC
  Administered 2020-12-09: 4 mg via INTRAVENOUS
  Filled 2020-12-09: qty 2

## 2020-12-09 NOTE — ED Provider Notes (Signed)
[facility] EMERGENCY DEPARTMENT Provider Note   CSN: [id number] Arrival date & time: 12/09/20  9798     History Chief Complaint  Patient presents with  . Chest Pain    Derek Stephens is a 87 y.o. male.  Patient with no previous cardiac history, history of hypertension --presents the emergency department for acute onset of chest and abdominal pain starting around 8 AM today.  Patient states that he awoke in his normal state of health.  He states that he reached with his right arm to his back and began having acute onset of chest and abdominal pain which gradually worsened.  Patient's wife thinks he was slurring his speech at onset however this is resolved.  He denies weakness, numbness, or tingling in his arms or his legs.  Pain is worse with movement and deep breathing.  No associated shortness of breath reported to me (contridicting triage note).  He states that he feels very nauseous but has not vomited.  No diaphoresis.  No neck pain. Patient denies signs of stroke including: facial droop, aphasia, weakness/numbness in extremities, imbalance/trouble walking.  He has never had pain like this before.  No treatments prior to arrival.         Past Medical History:  Diagnosis Date  . Arthritis    lumbar degeneration   . Hypertension    PCP - does ekg. annually - last done 08/2011    Patient Active Problem List   Diagnosis Date Noted  . Cough   . Upper GI bleed 05/30/2018  . Acute blood loss anemia 05/30/2018  . Essential hypertension 05/30/2018  . History of gout 05/30/2018    Past Surgical History:  Procedure Laterality Date  . BIOPSY  05/30/2018   Procedure: BIOPSY;  Surgeon: Kathi Der, MD;  Location: [facility] ENDOSCOPY;  Service: Gastroenterology;;  . COLONOSCOPY     2009- wnl   . ESOPHAGOGASTRODUODENOSCOPY (EGD) WITH PROPOFOL N/A 05/30/2018   Procedure: ESOPHAGOGASTRODUODENOSCOPY (EGD) WITH PROPOFOL;  Surgeon: Kathi Der, MD;  Location: [facility]  ENDOSCOPY;  Service: Gastroenterology;  Laterality: N/A;  . LUMBAR LAMINECTOMY/DECOMPRESSION MICRODISCECTOMY  03/23/2012   Procedure: LUMBAR LAMINECTOMY/DECOMPRESSION MICRODISCECTOMY 2 LEVELS;  Surgeon: Hewitt Shorts, MD;  Location: [facility] NEURO ORS;  Service: Neurosurgery;  Laterality: N/A;  Lumbar Two-Three, Lumbar Three-Four Lumbar Laminectomy       No family history on file.  Social History   Tobacco Use  . Smoking status: Former Smoker    Quit date: 03/17/1987    Years since quitting: 33.7  . Smokeless tobacco: Never Used  Vaping Use  . Vaping Use: Never used  Substance Use Topics  . Alcohol use: No  . Drug use: No    Home Medications Prior to Admission medications   Medication Sig Start Date End Date Taking? Authorizing Provider  allopurinol (ZYLOPRIM) 100 MG tablet Take 100 mg by mouth daily with breakfast.     [provider]  amLODipine (NORVASC) 10 MG tablet Take 0.5 tablets (5 mg total) by mouth daily. 05/31/18   Darlin Drop, DO  Cholecalciferol (VITAMIN D PO) Take 1 tablet by mouth daily.    [provider]  ferrous sulfate 325 (65 FE) MG tablet Take 1 tablet (325 mg total) by mouth daily with breakfast. 05/31/18   Darlin Drop, DO  pantoprazole (PROTONIX) 40 MG tablet Take 1 tablet (40 mg total) by mouth 2 (two) times daily. 05/31/18 07/30/18  Darlin Drop, DO  TURMERIC PO Take 1 tablet by  mouth daily.    [provider]    Allergies    Patient has no known allergies.  Review of Systems   Review of Systems  Constitutional: Negative for diaphoresis and fever.  Eyes: Negative for redness.  Respiratory: Negative for cough and shortness of breath.   Cardiovascular: Positive for chest pain. Negative for palpitations and leg swelling.  Gastrointestinal: Positive for abdominal pain and nausea. Negative for constipation and vomiting.  Genitourinary: Negative for dysuria.  Musculoskeletal: Negative for back pain and neck pain.  Skin:  Negative for rash.  Neurological: Negative for syncope and light-headedness.  Psychiatric/Behavioral: The patient is not nervous/anxious.     Physical Exam Updated Vital Signs BP (!) 166/111   Pulse 77   Temp 97.8 F (36.6 C)   Resp 20   SpO2 97%   Physical Exam Vitals and nursing note reviewed.  Constitutional:      Appearance: He is well-developed and well-nourished. He is not diaphoretic.  HENT:     Head: Normocephalic and atraumatic.     Mouth/Throat:     Mouth: Mucous membranes are normal. Mucous membranes are not dry.  Eyes:     Conjunctiva/sclera: Conjunctivae normal.  Neck:     Vascular: Normal carotid pulses. No carotid bruit or JVD.     Trachea: Trachea normal. No tracheal deviation.  Cardiovascular:     Rate and Rhythm: Normal rate and regular rhythm.     Pulses: Intact distal pulses. No decreased pulses.          Radial pulses are 2+ on the right side and 2+ on the left side.     Heart sounds: Normal heart sounds, S1 normal and S2 normal. Heart sounds not distant. No murmur heard.   Pulmonary:     Effort: Pulmonary effort is normal. No respiratory distress.     Breath sounds: Normal breath sounds. No wheezing.  Chest:     Chest wall: Tenderness present.     Comments: No large pulsatile mass in the abdomen.  Patient has tenderness of the upper abdomen that is mild to moderate. Abdominal:     General: Bowel sounds are normal. Aorta is normal.     Palpations: Abdomen is soft.     Tenderness: There is no abdominal tenderness. There is no guarding or rebound.  Musculoskeletal:        General: No edema.     Cervical back: Normal range of motion and neck supple. No muscular tenderness.  Skin:    General: Skin is warm and dry.     Coloration: Skin is not pale.     Nails: There is no cyanosis.  Neurological:     Mental Status: He is alert.  Psychiatric:        Mood and Affect: Mood and affect normal.     ED Results / Procedures / Treatments   Labs (all  labs ordered are listed, but only abnormal results are displayed) Labs Reviewed  COMPREHENSIVE METABOLIC PANEL - Abnormal; Notable for the following components:      Result Value   Glucose, Bld 106 (*)    Creatinine, Ser 1.70 (*)    Total Protein 9.0 (*)    Total Bilirubin 1.3 (*)    GFR, Estimated 38 (*)    All other components within normal limits  URINE CULTURE  LIPASE, BLOOD  CBC  URINALYSIS, ROUTINE W REFLEX MICROSCOPIC  TROPONIN I (HIGH SENSITIVITY)  TROPONIN I (HIGH SENSITIVITY)    EKG EKG Interpretation  Date/Time:  Tuesday December 09 2020 10:03:17 EST Ventricular Rate:  69 PR Interval:  212 QRS Duration: 100 QT Interval:  418 QTC Calculation: 447 R Axis:   -42 Text Interpretation: Sinus rhythm with 1st degree A-V block Left axis deviation Abnormal ECG Confirmed by Marianna Fussykstra, Richard ([id number]) on 12/09/2020 12:23:04 PM   Radiology DG Chest 2 View  Result Date: 12/09/2020 CLINICAL DATA:  87 year old male with acute chest pain. EXAM: CHEST - 2 VIEW COMPARISON:  Portable chest 05/30/2018 and earlier. FINDINGS: Upright AP and lateral views of the chest. Chronic tortuosity of the thoracic aorta has only mildly progressed since 2013. Other mediastinal contours are within normal limits. Visualized tracheal air column is within normal limits. Lung volumes are stable and within normal limits. Both lungs appear clear. No pneumothorax or pleural effusion. No acute osseous abnormality identified. Chronic flowing endplate osteophytes in the midthoracic spine. Negative visible bowel gas. IMPRESSION: 1. No acute cardiopulmonary abnormality. 2. Chronic tortuosity of the thoracic aorta with only mild progression since 2013. Electronically Signed   By: Odessa FlemingH  Hall M.D.   On: 12/09/2020 10:49   CT Angio Chest/Abd/Pel for Dissection W and/or Wo Contrast  Result Date: 12/09/2020 CLINICAL DATA:  Chest pain or back pain, aortic dissection suspected severe chest, abd pain EXAM: CT ANGIOGRAPHY CHEST, ABDOMEN  AND PELVIS TECHNIQUE: Non-contrast CT of the chest was initially obtained. Multidetector CT imaging through the chest, abdomen and pelvis was performed using the standard protocol during bolus administration of intravenous contrast. Multiplanar reconstructed images and MIPs were obtained and reviewed to evaluate the vascular anatomy. CONTRAST:  80mL OMNIPAQUE IOHEXOL 350 MG/ML SOLN COMPARISON:  None. FINDINGS: CTA CHEST FINDINGS Cardiovascular: Preferential opacification of the thoracic aorta. No evidence of thoracic aortic aneurysm or dissection. No central pulmonary embolus. Atherosclerotic calcifications of the aorta and branch vessels. Scattered coronary artery calcifications. Normal heart size. No pericardial effusion. Mediastinum/Nodes: No enlarged mediastinal, hilar, or axillary lymph nodes. Trachea, and esophagus demonstrate no significant findings. Heterogeneous nodular appearance of the thyroid gland. Lungs/Pleura: Bibasilar atelectasis versus scarring. No suspicious pulmonary nodules or masses. No pneumothorax. No pericardial effusion. Musculoskeletal: Thoracic diffuse idiopathic skeletal hyperostosis. No acute osseous abnormality. Review of the MIP images confirms the above findings. CTA ABDOMEN AND PELVIS FINDINGS VASCULAR Aorta: Normal caliber aorta without aneurysm, dissection, vasculitis or significant stenosis. Celiac: Patent. There is moderate ostial narrowing with poststenotic dilation. SMA: Patent without evidence of aneurysm, dissection, vasculitis or significant stenosis. Renals: Both renal arteries are patent without evidence of aneurysm, dissection, vasculitis, fibromuscular dysplasia or significant stenosis. IMA: Patent without evidence of aneurysm, dissection, vasculitis or significant stenosis. Inflow: Patent without evidence of aneurysm, dissection, vasculitis or significant stenosis. Veins: No obvious venous abnormality within the limitations of this arterial phase study. Review of the  MIP images confirms the above findings. 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 Gallbladder is unremarkable. No biliary ductal dilation. Pancreas: Unremarkable Spleen: Unremarkable Adrenals/Urinary Tract: Bilateral adrenal glands are unremarkable. Bilateral renal cysts measuring up to 3.9 cm on the right in the upper pole on image 145/5 and 6.5 cm on the left in the lower pole image 188/5. No left-sided hydronephrosis. Moderate right hydronephrosis to the level of a 1 cm stone in the proximal ureter on image 205/5. No suspicious renal lesions. Urinary bladder is unremarkable. Stomach/Bowel: Stomach is within normal limits. Colonic diverticulosis without findings  of acute diverticulitis. No evidence of bowel wall thickening, distention, or inflammatory changes. Lymphatic: No pathologically enlarged abdominal or pelvic lymph nodes. Reproductive: Prostatomegaly. Other: No abdominopelvic ascites. Musculoskeletal: Multilevel degenerative change of the lumbar spine. No acute osseous abnormality. Review of the MIP images confirms the above findings. IMPRESSION: 1. No evidence of aortic aneurysm or dissection. 2. Moderate right hydroureteronephrosis to the level of a 1 cm obstructive stone in the proximal right ureter. 3. 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 Nonemergent hepatic protocol MRI of the abdomen with and without contrast is recommended for further evaluation. 4. Heterogeneous nodular appearance of the thyroid gland. Recommend thyroid ultrasound (ref: J Am Coll Radiol. 2015 Feb;12(2): 143-50). 5. 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 6. Aortic atherosclerosis.  Aortic Atherosclerosis (ICD10-I70.0).  Electronically Signed   By: Maudry Mayhew MD   On: 12/09/2020 14:40    Procedures Procedures   Medications Ordered in ED Medications  morphine 4 MG/ML injection 4 mg (4 mg Intravenous Given 12/09/20 1219)  ondansetron (ZOFRAN) injection 4 mg (4 mg Intravenous Given 12/09/20 1218)  iohexol (OMNIPAQUE) 350 MG/ML injection 80 mL (80 mLs Intravenous Contrast Given 12/09/20 1412)  ketorolac (TORADOL) 15 MG/ML injection 15 mg (15 mg Intravenous Given 12/09/20 1508)  sodium chloride 0.9 % bolus 500 mL (500 mLs Intravenous New Bag/Given 12/09/20 1507)  oxyCODONE (Oxy IR/ROXICODONE) immediate release tablet 5 mg (5 mg Oral Given 12/09/20 1509)    ED Course  I have reviewed the triage vital signs and the nursing notes.  Pertinent labs & imaging results that were available during my care of the patient were reviewed by me and considered in my medical decision making (see chart for details).  Patient seen and examined. Work-up initiated. Medications ordered. EKG reviewed. Pt discussed with Dr. Stevie Kern.   Vital signs reviewed and are as follows: BP (!) 166/111   Pulse 77   Temp 97.8 F (36.6 C)   Resp 20   SpO2 97%   2:06 PM Pt discussed with and seen by Dr. Stevie Kern. CXR reviewed. Pain meds and CT dissection protocol ordered.   Troponin 10 > 12. Pt currently in CT.   2:56 PM CT demonstrates 1cm proximal right ureteral stone.  Patient's pain is better controlled but not resolved.  We will give dose of Toradol, oral oxycodone, small fluid bolus.  Plan: Home with oxycodone, Flomax, urology follow-up.  Currently awaiting UA.  Plan for discharge to home after completion of treatment.  Discussed with patient the need for close urology follow-up given size of stone.  Use pain medication only under direct supervision at the lowest possible dose needed to control your pain.   3:17 PM Signout to [employer] at shift change. Plan as above.      MDM Rules/Calculators/A&P                          Patient  presents with chest and abdominal pain.  ACS work-up is reassuring.  Pain appears to be from right ureteral stone.  Creatinine is stable.  UA pending.  Doubt sepsis.  Patient improving during treatment in ED.   Final Clinical Impression(s) / ED Diagnoses Final diagnoses:  Ureteral colic    Rx / DC Orders ED Discharge Orders         Ordered    oxyCODONE (OXY IR/ROXICODONE) 5 MG immediate release tablet  Every 6 hours PRN        12/09/20 1454    tamsulosin (FLOMAX) 0.4 MG CAPS capsule  Daily        12/09/20 1454           Renne Crigler, PA-C 12/09/20 1517    Milagros Loll, MD 12/12/20 [phone]

## 2020-12-09 NOTE — ED Provider Notes (Signed)
Received patient as a handoff at shift change from [facility], [place].  In short, patient is an 87 year old male who presented to the ED with complaints of acute onset chest pain and abdominal pain beginning at approximately 8 AM this morning.   At time of handoff, dissection study revealed right-sided hydronephrosis secondary to 1 cm obstructing stone in proximal right ureter.  Renal function consistent with baseline.  No leukocytosis.  Troponin trended x2 and within normal limits.  UA still pending.  Patient treated with Toradol and oxycodone.  Plan is for adequate pain control and await UA.  Anticipate discharge.  Physical Exam  BP (!) 162/84   Pulse 77   Temp 97.8 F (36.6 C)   Resp 16   Ht 5\' 11"  (1.803 m)   Wt 83.9 kg   SpO2 96%   BMI 25.80 kg/m   Physical Exam Constitutional:      Appearance: Normal appearance.  HENT:     Head: Normocephalic.  Eyes:     Extraocular Movements: Extraocular movements intact.     Pupils: Pupils are equal, round, and reactive to light.  Cardiovascular:     Rate and Rhythm: Normal rate.     Pulses: Normal pulses.  Pulmonary:     Effort: Pulmonary effort is normal. No respiratory distress.  Abdominal:     General: Abdomen is flat. There is no distension.     Palpations: Abdomen is soft.     Tenderness: There is no abdominal tenderness. There is no right CVA tenderness, left CVA tenderness or guarding.  Musculoskeletal:        General: Normal range of motion.     Cervical back: Normal range of motion. No rigidity.  Skin:    General: Skin is dry.  Neurological:     Mental Status: He is alert and oriented to person, place, and time.     ED Course/Procedures     Procedures Results for orders placed or performed during the hospital encounter of 12/09/20  Lipase, blood  Result Value Ref Range   Lipase 31 11 - 51 U/L  Comprehensive metabolic panel  Result Value Ref Range   Sodium 135 135 - 145 mmol/L   Potassium 3.5 3.5 - 5.1  mmol/L   Chloride 103 98 - 111 mmol/L   CO2 23 22 - 32 mmol/L   Glucose, Bld 106 (H) 70 - 99 mg/dL   BUN 14 8 - 23 mg/dL   Creatinine, Ser 02/08/21 (H) 0.61 - 1.24 mg/dL   Calcium 9.2 8.9 - 3.66 mg/dL   Total Protein 9.0 (H) 6.5 - 8.1 g/dL   Albumin 3.5 3.5 - 5.0 g/dL   AST 27 15 - 41 U/L   ALT 18 0 - 44 U/L   Alkaline Phosphatase 66 38 - 126 U/L   Total Bilirubin 1.3 (H) 0.3 - 1.2 mg/dL   GFR, Estimated 38 (L) >60 mL/min   Anion gap 9 5 - 15  CBC  Result Value Ref Range   WBC 5.1 4.0 - 10.5 K/uL   RBC 4.55 4.22 - 5.81 MIL/uL   Hemoglobin 14.4 13.0 - 17.0 g/dL   HCT 44.0 34.7 - 42.5 %   MCV 91.0 80.0 - 100.0 fL   MCH 31.6 26.0 - 34.0 pg   MCHC 34.8 30.0 - 36.0 g/dL   RDW 95.6 38.7 - 56.4 %   Platelets 184 150 - 400 K/uL   nRBC 0.0 0.0 - 0.2 %  Urinalysis, Routine w reflex microscopic Urine,  Clean Catch  Result Value Ref Range   Color, Urine STRAW (A) YELLOW   APPearance CLEAR CLEAR   Specific Gravity, Urine 1.017 1.005 - 1.030   pH 7.0 5.0 - 8.0   Glucose, UA NEGATIVE NEGATIVE mg/dL   Hgb urine dipstick NEGATIVE NEGATIVE   Bilirubin Urine NEGATIVE NEGATIVE   Ketones, ur 5 (A) NEGATIVE mg/dL   Protein, ur NEGATIVE NEGATIVE mg/dL   Nitrite NEGATIVE NEGATIVE   Leukocytes,Ua NEGATIVE NEGATIVE  Troponin I (High Sensitivity)  Result Value Ref Range   Troponin I (High Sensitivity) 10 <18 ng/L  Troponin I (High Sensitivity)  Result Value Ref Range   Troponin I (High Sensitivity) 12 <18 ng/L   DG Chest 2 View  Result Date: 12/09/2020 CLINICAL DATA:  87 year old male with acute chest pain. EXAM: CHEST - 2 VIEW COMPARISON:  Portable chest 05/30/2018 and earlier. FINDINGS: Upright AP and lateral views of the chest. Chronic tortuosity of the thoracic aorta has only mildly progressed since 2013. Other mediastinal contours are within normal limits. Visualized tracheal air column is within normal limits. Lung volumes are stable and within normal limits. Both lungs appear clear. No  pneumothorax or pleural effusion. No acute osseous abnormality identified. Chronic flowing endplate osteophytes in the midthoracic spine. Negative visible bowel gas. IMPRESSION: 1. No acute cardiopulmonary abnormality. 2. Chronic tortuosity of the thoracic aorta with only mild progression since 2013. Electronically Signed   By: Odessa Fleming M.D.   On: 12/09/2020 10:49   CT Angio Chest/Abd/Pel for Dissection W and/or Wo Contrast  Result Date: 12/09/2020 CLINICAL DATA:  Chest pain or back pain, aortic dissection suspected severe chest, abd pain EXAM: CT ANGIOGRAPHY CHEST, ABDOMEN AND PELVIS TECHNIQUE: Non-contrast CT of the chest was initially obtained. Multidetector CT imaging through the chest, abdomen and pelvis was performed using the standard protocol during bolus administration of intravenous contrast. Multiplanar reconstructed images and MIPs were obtained and reviewed to evaluate the vascular anatomy. CONTRAST:  33mL OMNIPAQUE IOHEXOL 350 MG/ML SOLN COMPARISON:  None. FINDINGS: CTA CHEST FINDINGS Cardiovascular: Preferential opacification of the thoracic aorta. No evidence of thoracic aortic aneurysm or dissection. No central pulmonary embolus. Atherosclerotic calcifications of the aorta and branch vessels. Scattered coronary artery calcifications. Normal heart size. No pericardial effusion. Mediastinum/Nodes: No enlarged mediastinal, hilar, or axillary lymph nodes. Trachea, and esophagus demonstrate no significant findings. Heterogeneous nodular appearance of the thyroid gland. Lungs/Pleura: Bibasilar atelectasis versus scarring. No suspicious pulmonary nodules or masses. No pneumothorax. No pericardial effusion. Musculoskeletal: Thoracic diffuse idiopathic skeletal hyperostosis. No acute osseous abnormality. Review of the MIP images confirms the above findings. CTA ABDOMEN AND PELVIS FINDINGS VASCULAR Aorta: Normal caliber aorta without aneurysm, dissection, vasculitis or significant stenosis. Celiac: Patent.  There is moderate ostial narrowing with poststenotic dilation. SMA: Patent without evidence of aneurysm, dissection, vasculitis or significant stenosis. Renals: Both renal arteries are patent without evidence of aneurysm, dissection, vasculitis, fibromuscular dysplasia or significant stenosis. IMA: Patent without evidence of aneurysm, dissection, vasculitis or significant stenosis. Inflow: Patent without evidence of aneurysm, dissection, vasculitis or significant stenosis. Veins: No obvious venous abnormality within the limitations of this arterial phase study. Review of the MIP images confirms the above findings. 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 Gallbladder is unremarkable. No biliary ductal dilation. Pancreas: Unremarkable Spleen: Unremarkable Adrenals/Urinary Tract: Bilateral adrenal glands are unremarkable. Bilateral renal cysts measuring up to  3.9 cm on the right in the upper pole on image 145/5 and 6.5 cm on the left in the lower pole image 188/5. No left-sided hydronephrosis. Moderate right hydronephrosis to the level of a 1 cm stone in the proximal ureter on image 205/5. No suspicious renal lesions. Urinary bladder is unremarkable. Stomach/Bowel: Stomach is within normal limits. Colonic diverticulosis without findings of acute diverticulitis. No evidence of bowel wall thickening, distention, or inflammatory changes. Lymphatic: No pathologically enlarged abdominal or pelvic lymph nodes. Reproductive: Prostatomegaly. Other: No abdominopelvic ascites. Musculoskeletal: Multilevel degenerative change of the lumbar spine. No acute osseous abnormality. Review of the MIP images confirms the above findings. IMPRESSION: 1. No evidence of aortic aneurysm or dissection. 2. Moderate right hydroureteronephrosis to the level of a 1 cm  obstructive stone in the proximal right ureter. 3. 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 Nonemergent hepatic protocol MRI of the abdomen with and without contrast is recommended for further evaluation. 4. Heterogeneous nodular appearance of the thyroid gland. Recommend thyroid ultrasound (ref: J Am Coll Radiol. 2015 Feb;12(2): 143-50). 5. 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 6. Aortic atherosclerosis.  Aortic Atherosclerosis (ICD10-I70.0). Electronically Signed   By: Maudry Mayhew MD   On: 12/09/2020 14:40    MDM   UA without evidence of infection.  Urine culture still pending.  On my examination, patient states that he was perfectly fine until sudden onset pain symptoms that began at 8 AM.  He was initially concern for chest discomfort, but denies any chest pain or shortness of breath at present.  Instead, he is localizing to the right side of his abdomen.  Given that the obstructing ureteral stone is 1 cm size and in the proximal ureter, will consult with his urology team at [facility].  He states that his pain symptoms are still persistent, however he reports that is improved and he is not in any acute distress.  Suspect that patient would benefit from lithotripsy or surgical intervention.    I spoke with Dr. Lindajo Royal who will notify the scheduler but asked that we have him call their office first thing tomorrow morning to schedule an appointment for urgent evaluation and to determine treatment options.  In the interim, send him home with Norco, Flomax, and encourage him to drink plenty of fluids.  Also recommending a strainer if we have one available to give.   Lorelee New, PA-C 12/09/20 1818  Wynetta Fines, MD 12/12/20 1018

## 2020-12-09 NOTE — Discharge Instructions (Addendum)
Please read and follow all provided instructions.  Your diagnoses today include:  1. Ureteral colic     Tests performed today include: Urine test that showed blood in your urine and no infection CT scan which showed a 10 millimeter kidney stone on the right side, nodular appearance of the thyroid gland and a 2.1cm lesion in the liver that is likely a blood vessel. Please let your doctor know about the thyroid and liver findings so they can follow-up on these if needed.   Blood test that showed stable kidney function Vital signs. See below for your results today.   Medications prescribed:  Oxycodone - narcotic pain medication  DO NOT drive or perform any activities that require you to be awake and alert because this medicine can make you drowsy.   Use pain medication only under direct supervision at the lowest possible dose needed to control your pain.   Flomax (tamsulosin) - relaxes smooth muscle to help kidney stones pass  Take any prescribed medications only as directed.  Home care instructions:  Follow any educational materials contained in this packet.  Please double your fluid intake for the next several days. Strain your urine and save any stones that may pass.   BE VERY CAREFUL not to take multiple medicines containing Tylenol (also called acetaminophen). Doing so can lead to an overdose which can damage your liver and cause liver failure and possibly death.   Follow-up instructions: Please follow-up with your urologist or the urologist referral (provided on front page) in the next 1 week for further evaluation of your symptoms.  If you need to return to the Emergency Department, go to [facility] and not [facility]. The urologists are located at [facility] and can better care for you at this location.  Return instructions:  If you need to return to the Emergency Department, go to [facility] and not [facility]. The urologists are  located at [facility] and can better care for you at this location.  Please return to the Emergency Department if you experience worsening symptoms.  Please return if you develop fever or uncontrolled pain or vomiting. Please return if you have any other emergent concerns.  Additional Information:  Your vital signs today were: BP (!) 163/92   Pulse 86   Temp 97.8 F (36.6 C)   Resp 19   Ht 5\' 11"  (1.803 m)   Wt 83.9 kg   SpO2 98%   BMI 25.80 kg/m  If your blood pressure (BP) was elevated above 135/85 this visit, please have this repeated by your doctor within one month. --------------

## 2020-12-09 NOTE — ED Notes (Signed)
Pt taken to scan at this time. 

## 2020-12-09 NOTE — ED Triage Notes (Signed)
Pt here with complaints of chest pain onset approx 2 hours pta. Pt endorses shob and nausea.

## 2020-12-10 ENCOUNTER — Other Ambulatory Visit: Payer: Self-pay | Admitting: Urology

## 2020-12-10 DIAGNOSIS — N201 Calculus of ureter: Secondary | ICD-10-CM | POA: Diagnosis not present

## 2020-12-11 ENCOUNTER — Other Ambulatory Visit (HOSPITAL_COMMUNITY)
Admission: RE | Admit: 2020-12-11 | Discharge: 2020-12-11 | Disposition: A | Discharge status: Home / Self Care | Payer: PPO | Source: Ambulatory Visit | Attending: Urology | Admitting: Urology

## 2020-12-11 DIAGNOSIS — Z01812 Encounter for preprocedural laboratory examination: Secondary | ICD-10-CM | POA: Diagnosis not present

## 2020-12-11 DIAGNOSIS — Z20822 Contact with and (suspected) exposure to covid-19: Secondary | ICD-10-CM | POA: Diagnosis not present

## 2020-12-11 LAB — URINE CULTURE: Culture: <10000 — AB

## 2020-12-11 LAB — SARS CORONAVIRUS 2 (TAT 6-24 HRS): SARS Coronavirus 2: NEGATIVE

## 2020-12-11 NOTE — Progress Notes (Signed)
Talked with patient. Instructions given. Clear liquids until 0330 take am meds with sip of water. Arrival time 0600. Wife is the driver

## 2020-12-15 ENCOUNTER — Ambulatory Visit (HOSPITAL_BASED_OUTPATIENT_CLINIC_OR_DEPARTMENT_OTHER)
Admission: RE | Admit: 2020-12-15 | Discharge: 2020-12-15 | Disposition: A | Discharge status: Home / Self Care | Payer: PPO | Attending: Urology | Admitting: Urology

## 2020-12-15 ENCOUNTER — Encounter (HOSPITAL_BASED_OUTPATIENT_CLINIC_OR_DEPARTMENT_OTHER): Payer: Self-pay | Admitting: Urology

## 2020-12-15 ENCOUNTER — Encounter (HOSPITAL_BASED_OUTPATIENT_CLINIC_OR_DEPARTMENT_OTHER)
Admission: RE | Disposition: A | Discharge status: Home / Self Care | Payer: Self-pay | Source: Home / Self Care | Attending: Urology

## 2020-12-15 ENCOUNTER — Ambulatory Visit (HOSPITAL_COMMUNITY): Payer: PPO

## 2020-12-15 ENCOUNTER — Other Ambulatory Visit: Payer: Self-pay

## 2020-12-15 DIAGNOSIS — Z79899 Other long term (current) drug therapy: Secondary | ICD-10-CM | POA: Insufficient documentation

## 2020-12-15 DIAGNOSIS — Z01818 Encounter for other preprocedural examination: Secondary | ICD-10-CM | POA: Diagnosis not present

## 2020-12-15 DIAGNOSIS — Z87891 Personal history of nicotine dependence: Secondary | ICD-10-CM | POA: Diagnosis not present

## 2020-12-15 DIAGNOSIS — N201 Calculus of ureter: Secondary | ICD-10-CM

## 2020-12-15 DIAGNOSIS — N200 Calculus of kidney: Secondary | ICD-10-CM | POA: Diagnosis not present

## 2020-12-15 DIAGNOSIS — M533 Sacrococcygeal disorders, not elsewhere classified: Secondary | ICD-10-CM | POA: Diagnosis not present

## 2020-12-15 HISTORY — PX: EXTRACORPOREAL SHOCK WAVE LITHOTRIPSY: SHX1557

## 2020-12-15 SURGERY — LITHOTRIPSY, ESWL
Anesthesia: LOCAL | Laterality: Right

## 2020-12-15 MED ORDER — DIPHENHYDRAMINE HCL 25 MG PO CAPS
25.0000 mg | ORAL_CAPSULE | ORAL | Status: AC
  Administered 2020-12-15: 25 mg via ORAL

## 2020-12-15 MED ORDER — DIAZEPAM 5 MG PO TABS
10.0000 mg | ORAL_TABLET | ORAL | Status: AC
  Administered 2020-12-15: 10 mg via ORAL

## 2020-12-15 MED ORDER — OXYCODONE HCL 5 MG PO TABS
5.0000 mg | ORAL_TABLET | ORAL | Status: DC | PRN
Start: 2020-12-15 — End: 2020-12-15

## 2020-12-15 MED ORDER — SODIUM CHLORIDE 0.9 % IV SOLN: INTRAVENOUS | Status: DC

## 2020-12-15 MED ORDER — DIAZEPAM 5 MG PO TABS
ORAL_TABLET | ORAL | Status: AC
  Filled 2020-12-15: qty 2

## 2020-12-15 MED ORDER — CIPROFLOXACIN HCL 500 MG PO TABS
500.0000 mg | ORAL_TABLET | ORAL | Status: AC
  Administered 2020-12-15: 500 mg via ORAL

## 2020-12-15 MED ORDER — DIPHENHYDRAMINE HCL 25 MG PO CAPS
ORAL_CAPSULE | ORAL | Status: AC
  Filled 2020-12-15: qty 1

## 2020-12-15 MED ORDER — CIPROFLOXACIN HCL 500 MG PO TABS
ORAL_TABLET | ORAL | Status: AC
  Filled 2020-12-15: qty 1

## 2020-12-15 NOTE — H&P (Signed)
Cc: Right ureteral stone  History of present illness: 87 year old male who presented to the emergency department on 12/09/2020 with generalized abdominal pain.  Work-up ensued and ultimately the patient was found to have a 1 cm right mid ureteral stone.  There is no evidence of infection.  He was given pain medication in the emergency department and his pain improved.  He was discharged home and seen in our clinic by Dr. Laverle Patter the next day.  At that time the patient was noted to be stable with no evidence of infection and well controlled pain.  The decision was made to proceed with shockwave lithotripsy.  The patient presents today for his procedure.  He denies any changes to his history and physical.  His pain has remained under control on tamsulosin and oxycodone.  He was also started on ciprofloxacin 250 mg twice daily.  Review of systems: A 12 point comprehensive review of systems was obtained and is negative unless otherwise stated in the history of present illness.  Patient Active Problem List   Diagnosis Date Noted  . Cough   . Upper GI bleed 05/30/2018  . Acute blood loss anemia 05/30/2018  . Essential hypertension 05/30/2018  . History of gout 05/30/2018    No current facility-administered medications on file prior to encounter.   Current Outpatient Medications on File Prior to Encounter  Medication Sig Dispense Refill  . allopurinol (ZYLOPRIM) 100 MG tablet Take 100 mg by mouth daily with breakfast.     . amLODipine (NORVASC) 10 MG tablet Take 0.5 tablets (5 mg total) by mouth daily. (Patient taking differently: Take 10 mg by mouth daily.) 30 tablet 0  . Cholecalciferol (VITAMIN D PO) Take 1 tablet by mouth daily.    . ciprofloxacin (CIPRO) 250 MG tablet Take 250 mg by mouth 2 (two) times daily.    Tery Sanfilippo Sodium 100 MG capsule Take 100 mg by mouth at bedtime.    Marland Kitchen oxyCODONE (OXY IR/ROXICODONE) 5 MG immediate release tablet Take 0.5-1 tablets (2.5-5 mg total) by mouth every 6  (six) hours as needed for severe pain. 8 tablet 0  . tamsulosin (FLOMAX) 0.4 MG CAPS capsule Take 1 capsule (0.4 mg total) by mouth daily. 7 capsule 0  . TURMERIC PO Take 1 tablet by mouth daily.    Marland Kitchen ALPRAZolam (XANAX) 0.25 MG tablet Take 0.25 mg by mouth daily as needed for anxiety.    . mirtazapine (REMERON) 15 MG tablet Take 15 mg by mouth at bedtime.    . pantoprazole (PROTONIX) 40 MG tablet Take 1 tablet (40 mg total) by mouth 2 (two) times daily. 120 tablet 0  . tamsulosin (FLOMAX) 0.4 MG CAPS capsule Take 0.4 mg by mouth daily after supper.      Past Medical History:  Diagnosis Date  . Arthritis    lumbar degeneration   . Hypertension    PCP - does ekg. annually - last done 08/2011    Past Surgical History:  Procedure Laterality Date  . BIOPSY  05/30/2018   Procedure: BIOPSY;  Surgeon: Kathi Der, MD;  Location: [facility];  Service: Gastroenterology;;  . COLONOSCOPY     2009- wnl   . ESOPHAGOGASTRODUODENOSCOPY (EGD) WITH PROPOFOL N/A 05/30/2018   Procedure: ESOPHAGOGASTRODUODENOSCOPY (EGD) WITH PROPOFOL;  Surgeon: Kathi Der, MD;  Location: [facility] ENDOSCOPY;  Service: Gastroenterology;  Laterality: N/A;  . LUMBAR LAMINECTOMY/DECOMPRESSION MICRODISCECTOMY  03/23/2012   Procedure: LUMBAR LAMINECTOMY/DECOMPRESSION MICRODISCECTOMY 2 LEVELS;  Surgeon: Hewitt Shorts, MD;  Location: [facility] NEURO ORS;  Service: Neurosurgery;  Laterality: N/A;  Lumbar Two-Three, Lumbar Three-Four Lumbar Laminectomy    Social History   Tobacco Use  . Smoking status: Former Smoker    Quit date: 03/17/1987    Years since quitting: 33.7  . Smokeless tobacco: Never Used  Vaping Use  . Vaping Use: Never used  Substance Use Topics  . Alcohol use: No  . Drug use: No    History reviewed. No pertinent family history.  PE: Vitals:   12/11/20 1238 12/15/20 0633  BP:  (!) 143/78  Pulse:  70  Resp:  16  Temp:  98.1 F (36.7 C)  TempSrc:  Oral  SpO2:  98%  Weight:  82.6 kg  Height: 5'  11" (1.803 m) 5\' 11"  (1.803 m)   Patient appears to be in no acute distress  patient is alert and oriented x3 Atraumatic normocephalic head No cervical or supraclavicular lymphadenopathy appreciated No increased work of breathing, no audible wheezes/rhonchi Regular sinus rhythm/rate Abdomen is soft, nontender, nondistended, no CVA or suprapubic tenderness Lower extremities are symmetric without appreciable edema Grossly neurologically intact No identifiable skin lesions  No results for input(s): WBC, HGB, HCT in the last 72 hours. No results for input(s): NA, K, CL, CO2, GLUCOSE, BUN, CREATININE, CALCIUM in the last 72 hours. No results for input(s): LABPT, INR in the last 72 hours. No results for input(s): LABURIN in the last 72 hours. Results for orders placed or performed during the hospital encounter of 12/11/20  SARS CORONAVIRUS 2 (TAT 6-24 HRS) Nasopharyngeal Nasopharyngeal Swab     Status: None   Collection Time: 12/11/20 11:56 AM   Specimen: Nasopharyngeal Swab  Result Value Ref Range Status   SARS Coronavirus 2 NEGATIVE NEGATIVE Final    Comment: (NOTE) SARS-CoV-2 target nucleic acids are NOT DETECTED.  The SARS-CoV-2 RNA is generally detectable in upper and lower respiratory specimens during the acute phase of infection. Negative results do not preclude SARS-CoV-2 infection, do not rule out co-infections with other pathogens, and should not be used as the sole basis for treatment or other patient management decisions. Negative results must be combined with clinical observations, patient history, and epidemiological information. The expected result is Negative.  Fact Sheet for Patients: 02/10/21  Fact Sheet for Healthcare Providers: HairSlick.no  This test is not yet approved or cleared by the [employer]ierodir[employer]ir.[employer] FDA and  has been authorized for detection and/or diagnosis of SARS-CoV-2 by FDA under an  Emergency Use Authorization (EUA). This EUA will remain  in effect (meaning this test can be used) for the duration of the COVID-19 declaration under Se ction 564(b)(1) of the Act, 21 U.S.C. section 360bbb-3(b)(1), unless the authorization is terminated or revoked sooner.  Performed at [facility], [address], [place], [place]     Imaging: I reviewed the patient's CT scan performed in the emergency department as well as the KUB which was performed prior to his shockwave therapy for his right mid ureteral stone.  These demonstrate the stone in the mid right ureter.  Imp: 87 year old with a right mid ureteral obstructing stone.  Fortunately he has no evidence of infection his pain is reasonably well controlled.  His renal function also remains within normal limits.  Recommendations: After discussing all the treatment options to manage an obstructing mid ureteral stone the patient opted to proceed with shockwave lithotripsy.  The procedure was detailed for him including the associated risks and the benefits.  Having gone through all of the discussion  and questions the patient is opted proceed.   Crist Fat

## 2020-12-15 NOTE — Op Note (Signed)
See [facility] OP note scanned into chart. Also because of the size, density, location and other factors that cannot be anticipated I feel this will likely be a staged procedure. This fact supersedes any indication in the scanned [facility] operative note to the contrary.  

## 2020-12-15 NOTE — Discharge Instructions (Signed)
See [facility] discharge instructions in chart.  Post Anesthesia Home Care Instructions  Activity: Get plenty of rest for the remainder of the day. A responsible adult should stay with you for 24 hours following the procedure.  For the next 24 hours, DO NOT: -Drive a car -Operate machinery -Drink alcoholic beverages -Take any medication unless instructed by your physician -Make any legal decisions or sign important papers.  Meals: Start with liquid foods such as gelatin or soup. Progress to regular foods as tolerated. Avoid greasy, spicy, heavy foods. If nausea and/or vomiting occur, drink only clear liquids until the nausea and/or vomiting subsides. Call your physician if vomiting continues.  Special Instructions/Symptoms: Your throat may feel dry or sore from the anesthesia or the breathing tube placed in your throat during surgery. If this causes discomfort, gargle with warm salt water. The discomfort should disappear within 24 hours.  If you had a scopolamine patch placed behind your ear for the management of post- operative nausea and/or vomiting:  1. The medication in the patch is effective for 72 hours, after which it should be removed.  Wrap patch in a tissue and discard in the trash. Wash hands thoroughly with soap and water. 2. You may remove the patch earlier than 72 hours if you experience unpleasant side effects which may include dry mouth, dizziness or visual disturbances. 3. Avoid touching the patch. Wash your hands with soap and water after contact with the patch.    

## 2020-12-16 ENCOUNTER — Encounter (HOSPITAL_BASED_OUTPATIENT_CLINIC_OR_DEPARTMENT_OTHER): Payer: Self-pay | Admitting: Urology

## 2020-12-30 DIAGNOSIS — Z8711 Personal history of peptic ulcer disease: Secondary | ICD-10-CM | POA: Diagnosis not present

## 2020-12-30 DIAGNOSIS — R109 Unspecified abdominal pain: Secondary | ICD-10-CM | POA: Diagnosis not present

## 2020-12-30 DIAGNOSIS — R110 Nausea: Secondary | ICD-10-CM | POA: Diagnosis not present

## 2021-01-15 DIAGNOSIS — N201 Calculus of ureter: Secondary | ICD-10-CM | POA: Diagnosis not present

## 2021-01-21 DIAGNOSIS — R21 Rash and other nonspecific skin eruption: Secondary | ICD-10-CM | POA: Diagnosis not present

## 2021-01-21 DIAGNOSIS — R935 Abnormal findings on diagnostic imaging of other abdominal regions, including retroperitoneum: Secondary | ICD-10-CM | POA: Diagnosis not present

## 2021-01-21 DIAGNOSIS — R932 Abnormal findings on diagnostic imaging of liver and biliary tract: Secondary | ICD-10-CM | POA: Diagnosis not present

## 2021-01-21 DIAGNOSIS — I700 Atherosclerosis of aorta: Secondary | ICD-10-CM | POA: Diagnosis not present

## 2021-01-21 DIAGNOSIS — E041 Nontoxic single thyroid nodule: Secondary | ICD-10-CM | POA: Diagnosis not present

## 2021-01-27 DIAGNOSIS — N183 Chronic kidney disease, stage 3 unspecified: Secondary | ICD-10-CM | POA: Diagnosis not present

## 2021-01-27 DIAGNOSIS — N1831 Chronic kidney disease, stage 3a: Secondary | ICD-10-CM | POA: Diagnosis not present

## 2021-01-27 DIAGNOSIS — I10 Essential (primary) hypertension: Secondary | ICD-10-CM | POA: Diagnosis not present

## 2021-01-28 DIAGNOSIS — E041 Nontoxic single thyroid nodule: Secondary | ICD-10-CM | POA: Diagnosis not present

## 2021-02-10 DIAGNOSIS — N201 Calculus of ureter: Secondary | ICD-10-CM | POA: Diagnosis not present

## 2021-02-17 DIAGNOSIS — N281 Cyst of kidney, acquired: Secondary | ICD-10-CM | POA: Diagnosis not present

## 2021-02-17 DIAGNOSIS — N400 Enlarged prostate without lower urinary tract symptoms: Secondary | ICD-10-CM | POA: Diagnosis not present

## 2021-02-17 DIAGNOSIS — N210 Calculus in bladder: Secondary | ICD-10-CM | POA: Diagnosis not present

## 2021-02-17 DIAGNOSIS — N201 Calculus of ureter: Secondary | ICD-10-CM | POA: Diagnosis not present

## 2021-02-17 DIAGNOSIS — N132 Hydronephrosis with renal and ureteral calculous obstruction: Secondary | ICD-10-CM | POA: Diagnosis not present

## 2021-03-16 DIAGNOSIS — N183 Chronic kidney disease, stage 3 unspecified: Secondary | ICD-10-CM | POA: Diagnosis not present

## 2021-03-16 DIAGNOSIS — I10 Essential (primary) hypertension: Secondary | ICD-10-CM | POA: Diagnosis not present

## 2021-03-16 DIAGNOSIS — N1831 Chronic kidney disease, stage 3a: Secondary | ICD-10-CM | POA: Diagnosis not present

## 2021-03-17 DIAGNOSIS — R972 Elevated prostate specific antigen [PSA]: Secondary | ICD-10-CM | POA: Diagnosis not present

## 2021-03-26 DIAGNOSIS — R972 Elevated prostate specific antigen [PSA]: Secondary | ICD-10-CM | POA: Diagnosis not present

## 2021-03-26 DIAGNOSIS — N201 Calculus of ureter: Secondary | ICD-10-CM | POA: Diagnosis not present

## 2021-03-26 DIAGNOSIS — R351 Nocturia: Secondary | ICD-10-CM | POA: Diagnosis not present

## 2021-03-26 DIAGNOSIS — N401 Enlarged prostate with lower urinary tract symptoms: Secondary | ICD-10-CM | POA: Diagnosis not present

## 2021-04-16 DIAGNOSIS — F419 Anxiety disorder, unspecified: Secondary | ICD-10-CM | POA: Diagnosis not present

## 2021-04-16 DIAGNOSIS — K649 Unspecified hemorrhoids: Secondary | ICD-10-CM | POA: Diagnosis not present

## 2021-04-16 DIAGNOSIS — K5900 Constipation, unspecified: Secondary | ICD-10-CM | POA: Diagnosis not present

## 2021-05-06 DIAGNOSIS — N1831 Chronic kidney disease, stage 3a: Secondary | ICD-10-CM | POA: Diagnosis not present

## 2021-05-06 DIAGNOSIS — I10 Essential (primary) hypertension: Secondary | ICD-10-CM | POA: Diagnosis not present

## 2021-07-31 DIAGNOSIS — N1831 Chronic kidney disease, stage 3a: Secondary | ICD-10-CM | POA: Diagnosis not present

## 2021-07-31 DIAGNOSIS — I10 Essential (primary) hypertension: Secondary | ICD-10-CM | POA: Diagnosis not present

## 2021-08-07 DIAGNOSIS — H40013 Open angle with borderline findings, low risk, bilateral: Secondary | ICD-10-CM | POA: Diagnosis not present

## 2021-08-07 DIAGNOSIS — H2513 Age-related nuclear cataract, bilateral: Secondary | ICD-10-CM | POA: Diagnosis not present

## 2021-08-07 DIAGNOSIS — H25013 Cortical age-related cataract, bilateral: Secondary | ICD-10-CM | POA: Diagnosis not present

## 2021-08-07 DIAGNOSIS — H3581 Retinal edema: Secondary | ICD-10-CM | POA: Diagnosis not present

## 2021-08-19 DIAGNOSIS — H579 Unspecified disorder of eye and adnexa: Secondary | ICD-10-CM | POA: Diagnosis not present

## 2021-08-19 DIAGNOSIS — Z131 Encounter for screening for diabetes mellitus: Secondary | ICD-10-CM | POA: Diagnosis not present

## 2021-09-10 DIAGNOSIS — I10 Essential (primary) hypertension: Secondary | ICD-10-CM | POA: Diagnosis not present

## 2021-09-10 DIAGNOSIS — H35033 Hypertensive retinopathy, bilateral: Secondary | ICD-10-CM | POA: Diagnosis not present

## 2021-09-10 DIAGNOSIS — N183 Chronic kidney disease, stage 3 unspecified: Secondary | ICD-10-CM | POA: Diagnosis not present

## 2021-09-18 DIAGNOSIS — R972 Elevated prostate specific antigen [PSA]: Secondary | ICD-10-CM | POA: Diagnosis not present

## 2021-09-24 DIAGNOSIS — R972 Elevated prostate specific antigen [PSA]: Secondary | ICD-10-CM | POA: Diagnosis not present

## 2021-09-24 DIAGNOSIS — R351 Nocturia: Secondary | ICD-10-CM | POA: Diagnosis not present

## 2021-09-24 DIAGNOSIS — H04123 Dry eye syndrome of bilateral lacrimal glands: Secondary | ICD-10-CM | POA: Diagnosis not present

## 2021-09-24 DIAGNOSIS — N401 Enlarged prostate with lower urinary tract symptoms: Secondary | ICD-10-CM | POA: Diagnosis not present

## 2021-09-24 DIAGNOSIS — H25812 Combined forms of age-related cataract, left eye: Secondary | ICD-10-CM | POA: Diagnosis not present

## 2021-10-07 IMAGING — CR DG CHEST 2V
2 series · 2 of 2 positions shown · non-contrast
Comparison: Portable chest 05/30/2018 and earlier.

CLINICAL DATA: 89-year-old male with acute chest pain.

EXAM:
CHEST - 2 VIEW

[chest lat]
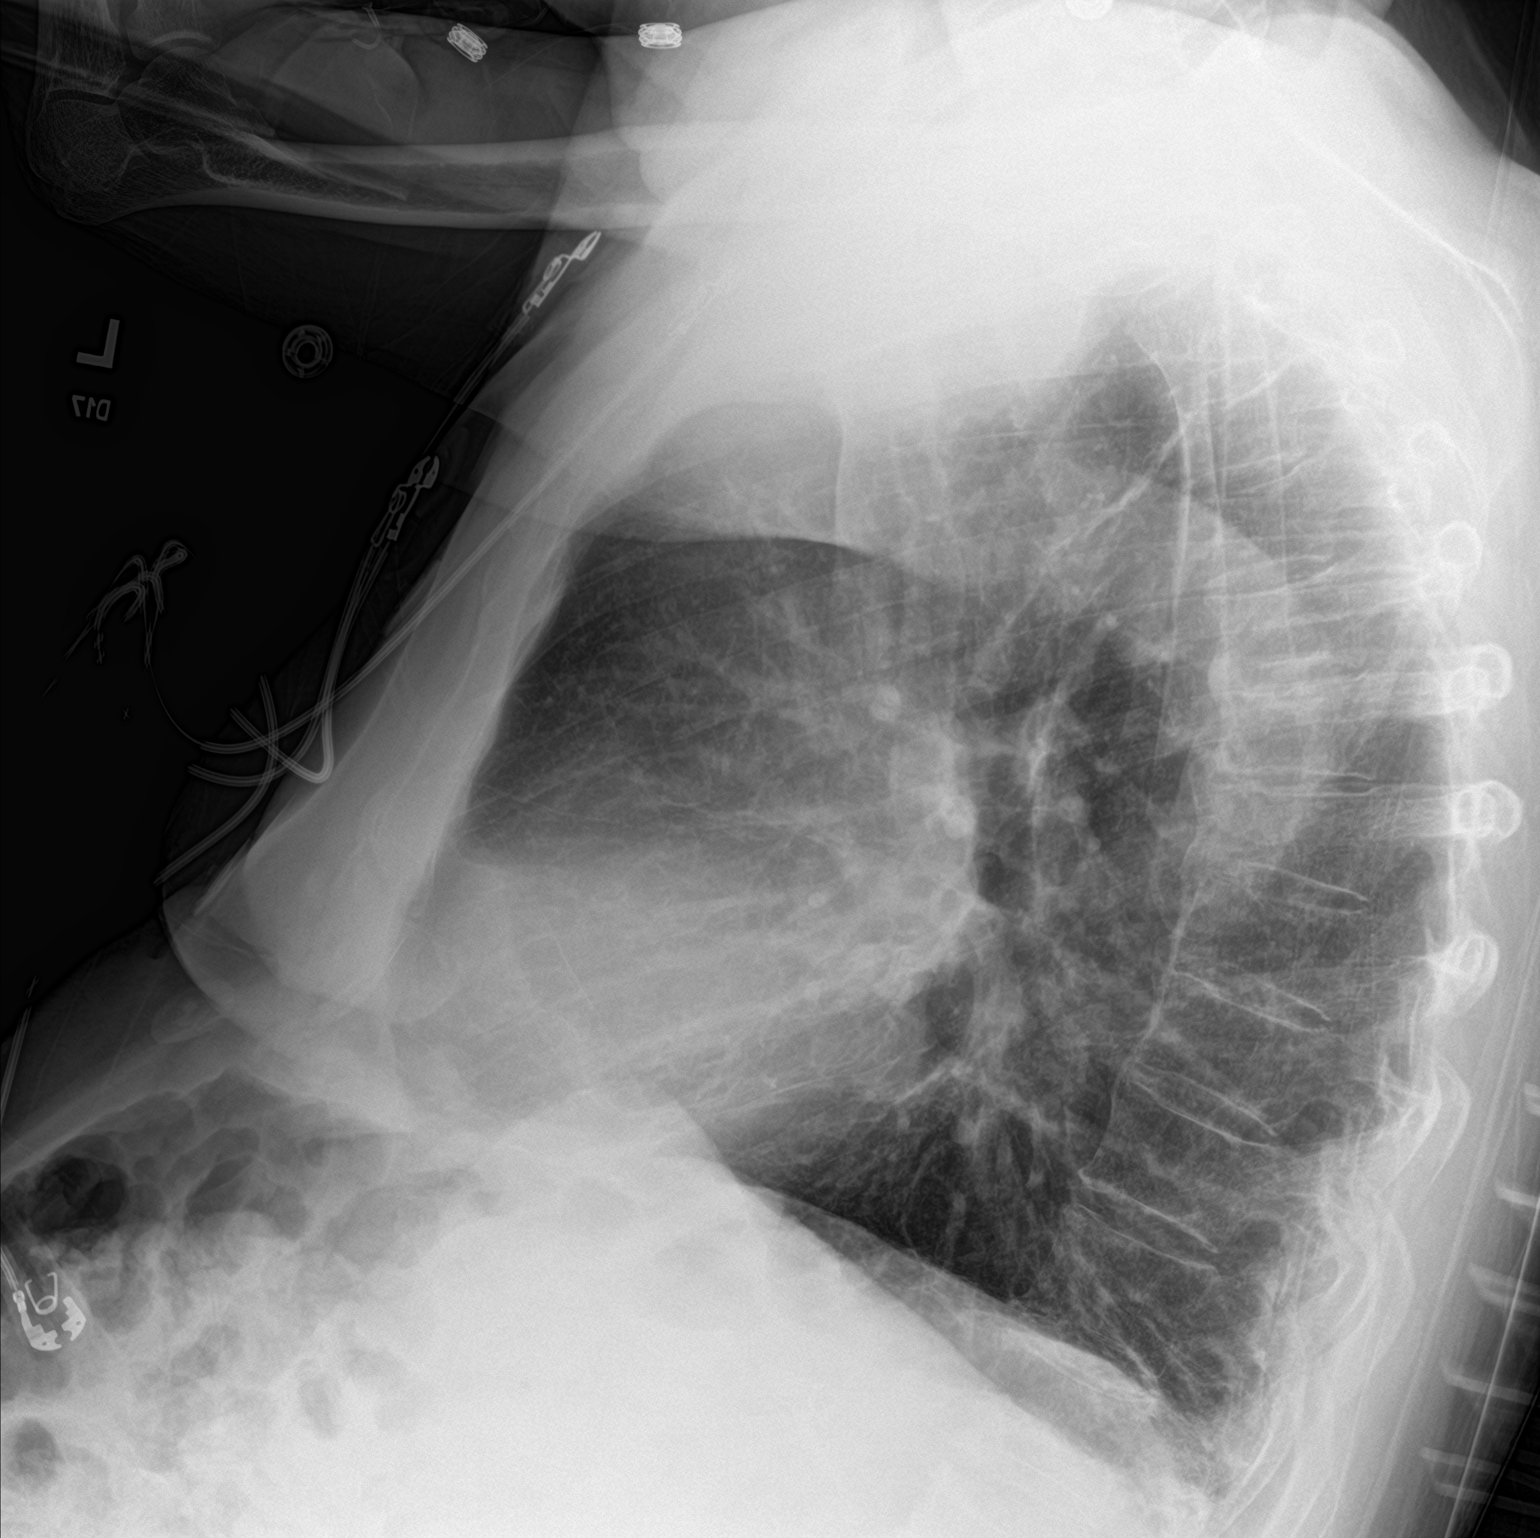

[chest ap]
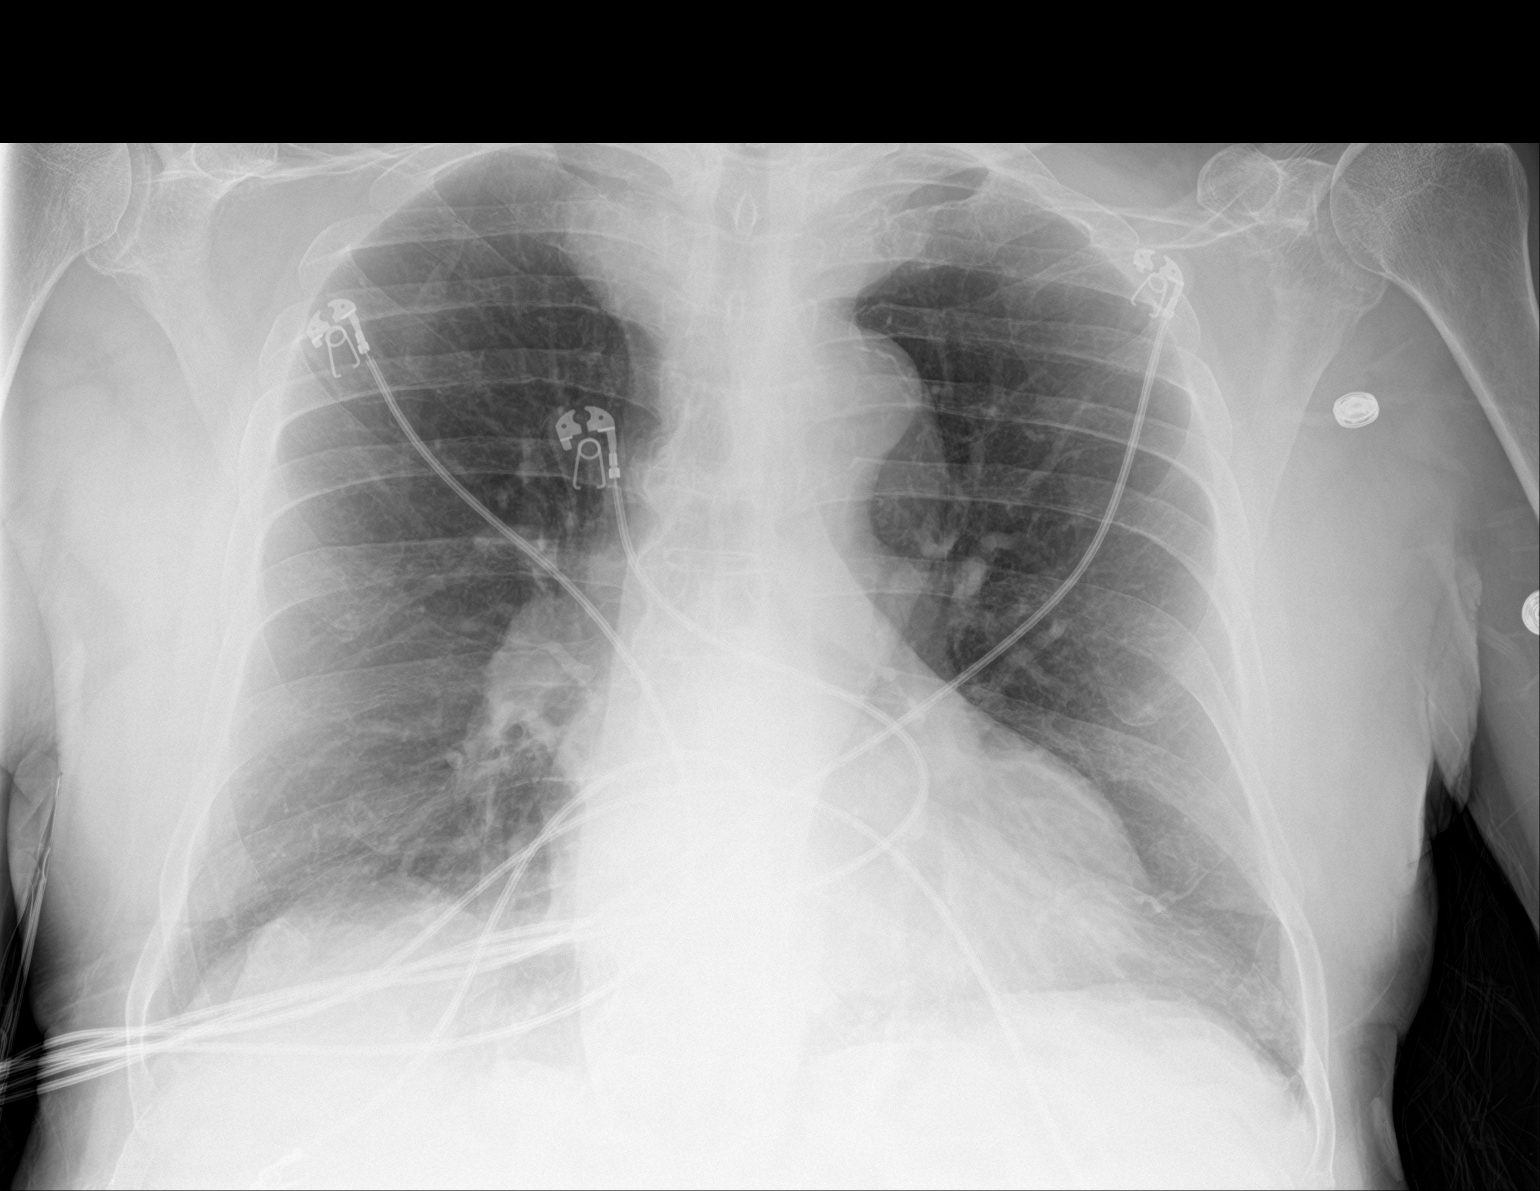

[2 of 2 positions shown; findings below may reference images not displayed]

FINDINGS: Upright AP and lateral views of the chest. Chronic tortuosity of the
thoracic aorta has only mildly progressed since 6427. Other
mediastinal contours are within normal limits. Visualized tracheal
air column is within normal limits. Lung volumes are stable and
within normal limits. Both lungs appear clear. No pneumothorax or
pleural effusion.

No acute osseous abnormality identified. Chronic flowing endplate
osteophytes in the midthoracic spine. Negative visible bowel gas.
IMPRESSION: 1. No acute cardiopulmonary abnormality.
2. Chronic tortuosity of the thoracic aorta with only mild
progression since [DATE].

## 2021-10-07 IMAGING — CT CT ANGIO CHEST-ABD-PELV FOR DISSECTION W/ AND WO/W CM
2 of 7 series · 12 of 46 positions shown, 14 images · IV contrast (APPLIED)
Comparison: None.

CLINICAL DATA: Chest pain or back pain, aortic dissection suspected
severe chest, abd pain

EXAM:
CT ANGIOGRAPHY CHEST, ABDOMEN AND PELVIS
TECHNIQUE: Non-contrast CT of the chest was initially obtained.

[Series 5: arterial · axial · arterial · 0.75mm/px · z∈[+766,+1326]mm · 9 of 316 slices shown, 11 images]
[im 18/316  soft-tissue]
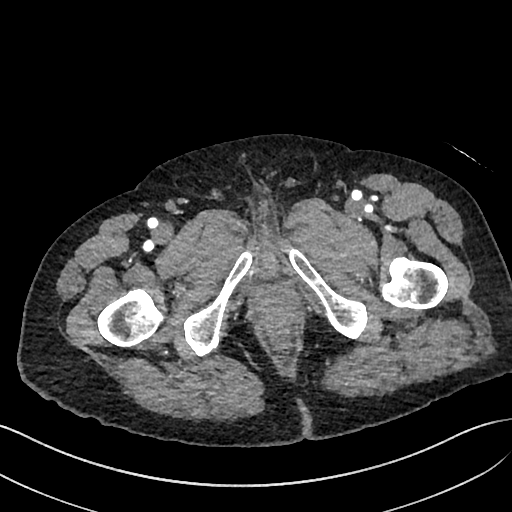
[im 18/316  bone]
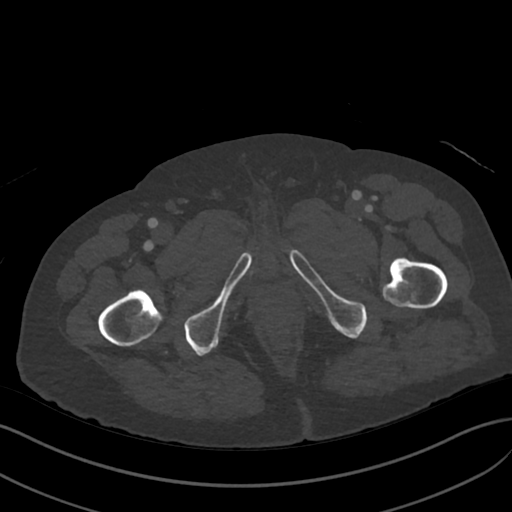
[im 53/316  soft-tissue]
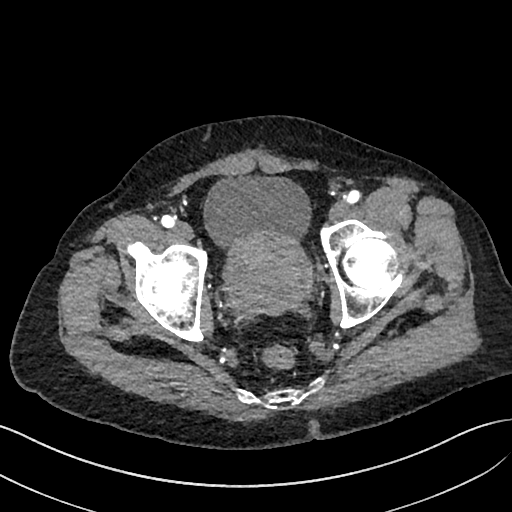
[im 88/316  soft-tissue]
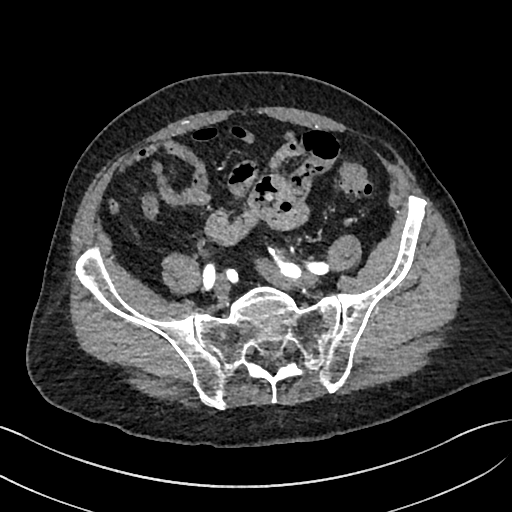
[im 123/316  soft-tissue]
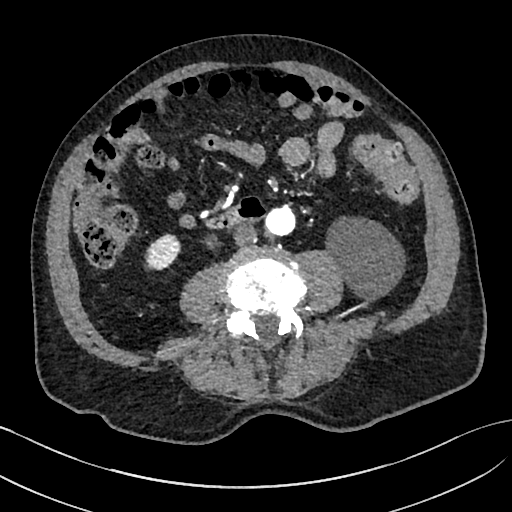
[im 158/316  soft-tissue]
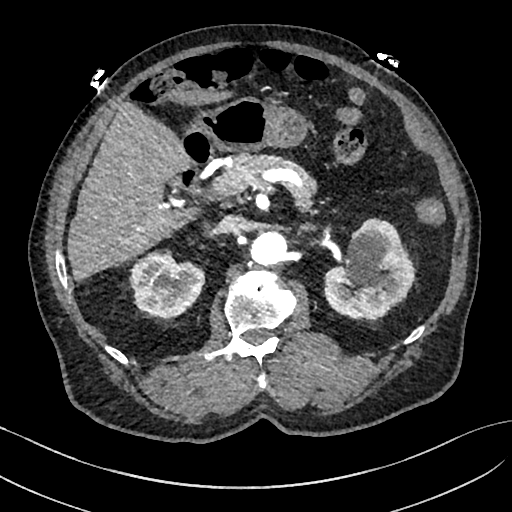
[im 193/316  soft-tissue]
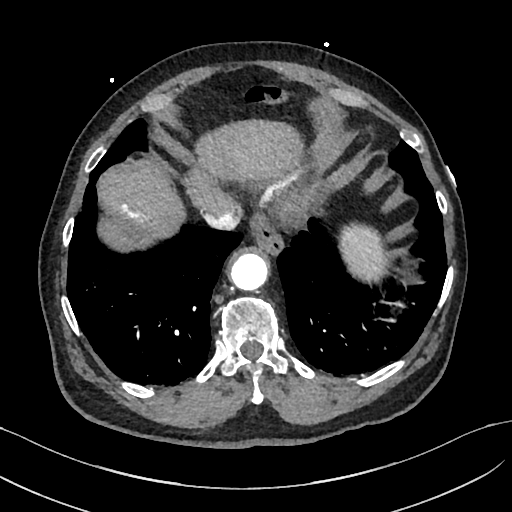
[im 228/316  soft-tissue]
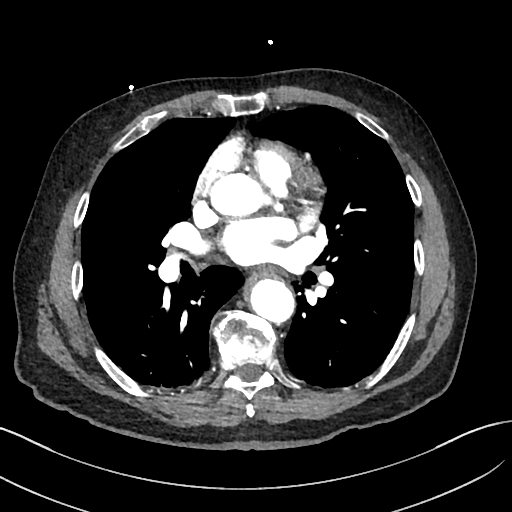
[im 263/316  soft-tissue]
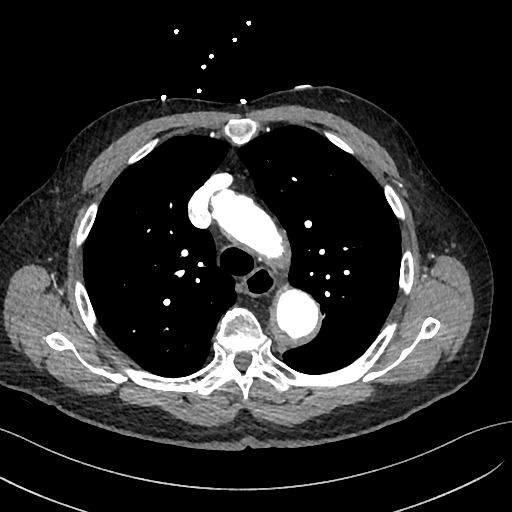
[im 298/316  soft-tissue]
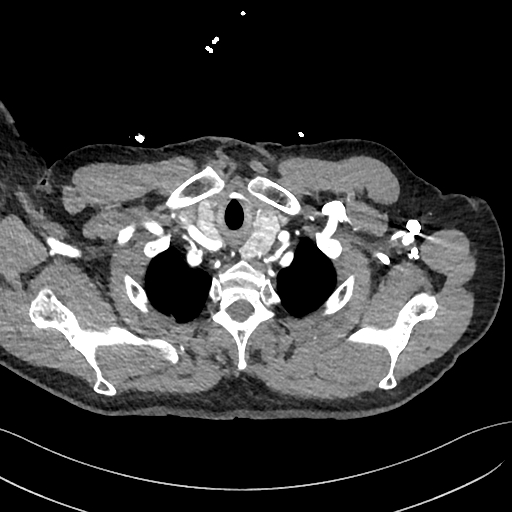
[im 298/316  bone]
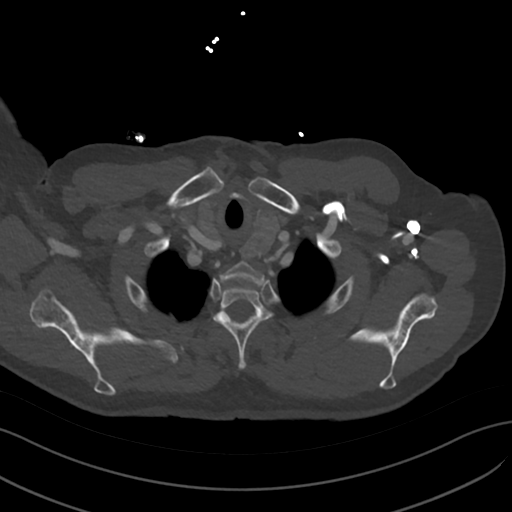

[Series 9: cor · coronal · 0.69mm/px · 3 of 151 slices shown]
[im 38/151  soft-tissue]
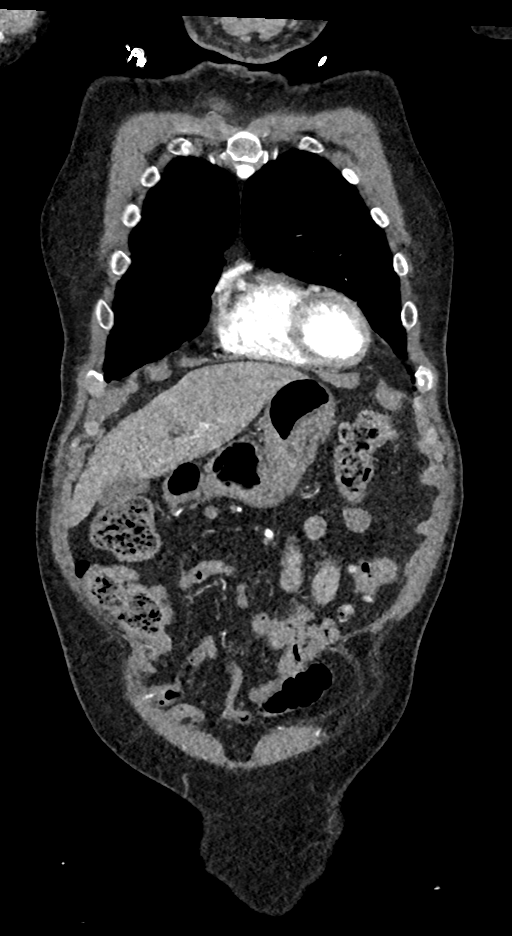
[im 76/151  soft-tissue]
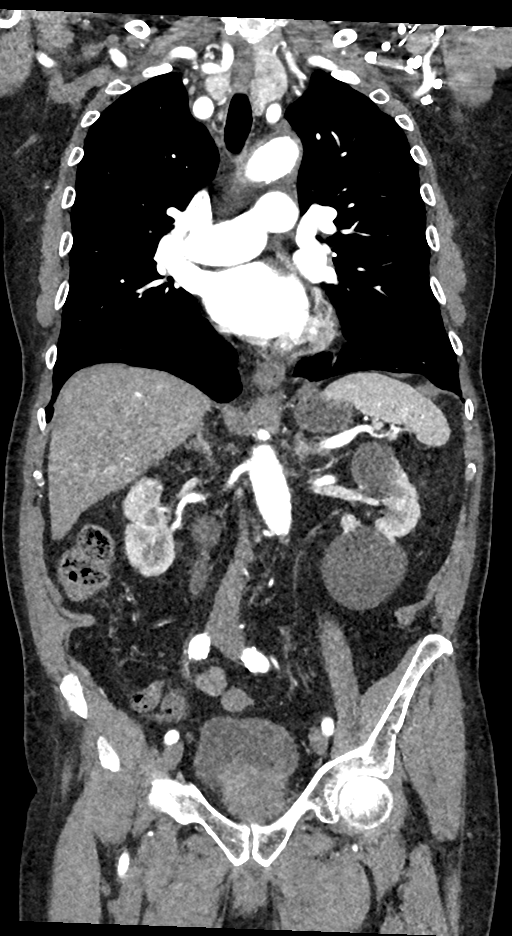
[im 113/151  soft-tissue]
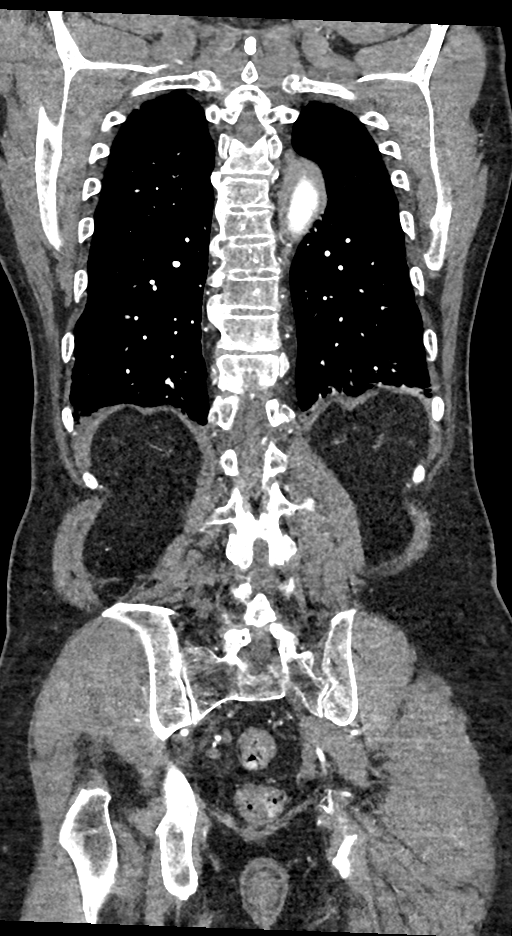

[12 of 46 positions shown; findings below may reference images not displayed]

Multidetector CT imaging through the chest, abdomen and pelvis was
performed using the standard protocol during bolus administration of
intravenous contrast. Multiplanar reconstructed images and MIPs were
obtained and reviewed to evaluate the vascular anatomy.

CONTRAST:  80mL OMNIPAQUE IOHEXOL 350 MG/ML SOLN
FINDINGS: CTA CHEST FINDINGS

Cardiovascular: Preferential opacification of the thoracic aorta. No
evidence of thoracic aortic aneurysm or dissection. No central
pulmonary embolus. Atherosclerotic calcifications of the aorta and
branch vessels. Scattered coronary artery calcifications. Normal
heart size. No pericardial effusion.

Mediastinum/Nodes: No enlarged mediastinal, hilar, or axillary lymph
nodes. Trachea, and esophagus demonstrate no significant findings.
Heterogeneous nodular appearance of the thyroid gland.

Lungs/Pleura: Bibasilar atelectasis versus scarring. No suspicious
pulmonary nodules or masses. No pneumothorax. No pericardial
effusion.

Musculoskeletal: Thoracic diffuse idiopathic skeletal hyperostosis.
No acute osseous abnormality.

Review of the MIP images confirms the above findings.

CTA ABDOMEN AND PELVIS FINDINGS

VASCULAR

Aorta: Normal caliber aorta without aneurysm, dissection, vasculitis
or significant stenosis.

Celiac: Patent. There is moderate ostial narrowing with poststenotic
dilation.

SMA: Patent without evidence of aneurysm, dissection, vasculitis or
significant stenosis.

Renals: Both renal arteries are patent without evidence of aneurysm,
dissection, vasculitis, fibromuscular dysplasia or significant
stenosis.

IMA: Patent without evidence of aneurysm, dissection, vasculitis or
significant stenosis.

Inflow: Patent without evidence of aneurysm, dissection, vasculitis
or significant stenosis.

Veins: No obvious venous abnormality within the limitations of this
arterial phase study.

Review of the MIP images confirms the above findings.

NON-VASCULAR

Hepatobiliary: Arterially enhancing 2.1 x 1.6 x 1.2 cm lesion in the
hepatic dome on image 120/phone and 64/151 with appears direct
vascular association, favored to represent an intrahepatic in
AVM/AVF. Gallbladder is unremarkable. No biliary ductal dilation.

Pancreas: Unremarkable

Spleen: Unremarkable

Adrenals/Urinary Tract: Bilateral adrenal glands are unremarkable.
Bilateral renal cysts measuring up to 3.9 cm on the right in the
upper pole on image 145/5 and 6.5 cm on the left in the lower pole
image 188/5. No left-sided hydronephrosis. Moderate right
hydronephrosis to the level of a 1 cm stone in the proximal ureter
on image 205/5. No suspicious renal lesions. Urinary bladder is
unremarkable.

Stomach/Bowel: Stomach is within normal limits. Colonic
diverticulosis without findings of acute diverticulitis. No evidence
of bowel wall thickening, distention, or inflammatory changes.

Lymphatic: No pathologically enlarged abdominal or pelvic lymph
nodes.

Reproductive: Prostatomegaly.

Other: No abdominopelvic ascites.

Musculoskeletal: Multilevel degenerative change of the lumbar spine.
No acute osseous abnormality.

Review of the MIP images confirms the above findings.
IMPRESSION: 1. No evidence of aortic aneurysm or dissection.
2. Moderate right hydroureteronephrosis to the level of a 1 cm
obstructive stone in the proximal right ureter.
3. Arterially enhancing 2.1 cm lesion in the hepatic dome with
apparent direct vascular connections, favored to represent an
intrahepatic in AVM/AVF. Nonemergent hepatic protocol MRI of the
abdomen with and without contrast is recommended for further
evaluation.
4. Heterogeneous nodular appearance of the thyroid gland. Recommend
thyroid ultrasound (ref: [HOSPITAL]. [DATE]): 143-50).
5. Moderate celiac artery ostial narrowing with poststenotic
dilation.
6. Aortic atherosclerosis.  Aortic Atherosclerosis (SY9V8-GI7.7).

## 2021-10-15 ENCOUNTER — Encounter (HOSPITAL_BASED_OUTPATIENT_CLINIC_OR_DEPARTMENT_OTHER): Payer: Self-pay | Admitting: Emergency Medicine

## 2021-10-15 ENCOUNTER — Other Ambulatory Visit: Payer: Self-pay

## 2021-10-15 ENCOUNTER — Emergency Department (HOSPITAL_BASED_OUTPATIENT_CLINIC_OR_DEPARTMENT_OTHER)
Admission: EM | Admit: 2021-10-15 | Discharge: 2021-10-15 | Disposition: A | Discharge status: Home / Self Care | Payer: PPO | Attending: Emergency Medicine | Admitting: Emergency Medicine

## 2021-10-15 DIAGNOSIS — I10 Essential (primary) hypertension: Secondary | ICD-10-CM | POA: Insufficient documentation

## 2021-10-15 DIAGNOSIS — R040 Epistaxis: Secondary | ICD-10-CM | POA: Diagnosis not present

## 2021-10-15 DIAGNOSIS — Z79899 Other long term (current) drug therapy: Secondary | ICD-10-CM | POA: Diagnosis not present

## 2021-10-15 MED ORDER — OXYMETAZOLINE HCL 0.05 % NA SOLN
2.0000 | Freq: Once | NASAL | Status: AC
  Administered 2021-10-15: 2 via NASAL
  Filled 2021-10-15: qty 30

## 2021-10-15 NOTE — ED Triage Notes (Addendum)
Had a nose bleed after dinner today, bright red , coughed and  a clot came out  and then it quite bleeding, no bloodthinners  he states , has a h/a no bleeding now and he is not swallowing any

## 2021-10-15 NOTE — Discharge Instructions (Addendum)
Please use humidifier in your bedroom at nighttime.  You may apply bacitracin or Vaseline topically to the inside of your nose over the next few days as needed to keep your nose moist  Avoid sticking objects in your nose  Please return to emergency department for any worsening or worrisome symptoms.

## 2021-10-15 NOTE — ED Provider Notes (Signed)
[facility] Provider Note   CSN: [id number] Arrival date & time: 10/15/21  1829     History  Chief Complaint  Patient presents with   Epistaxis    Derek Stephens is a 88 y.o. male.  This is a 88 y.o. male with significant medical history as below, including hypertension, gout who presents to the ED with complaint of right-sided nosebleed.  Patient reports that he was cleaning the dishes around 5 PM this evening when he appreciated brisk bleeding from his right nare.  Bleeding resolved spontaneously.  He swallowed a small amount of blood and coughed up a blood clot.  No dyspnea, no ongoing nausea or vomiting.  No blood thinners.  No nasal trauma.  Bleeding has not reoccurred since the initial episode.  No history of recurrent nosebleeds.  No home oxygen use.  No facial pain or discomfort.  No vision changes or hearing changes.  No neck discomfort.  No dysphagia or dysphonia.  No chest pain or dyspnea.  No fevers or chills.  Normal state felt prior to onset of this nosebleed.   The history is provided by the patient and the spouse. No language interpreter was used.  Epistaxis Associated symptoms: no cough, no fever and no headaches      Patient Active Problem List   Diagnosis Date Noted   Cough    Upper GI bleed 05/30/2018   Acute blood loss anemia 05/30/2018   Essential hypertension 05/30/2018   History of gout 05/30/2018     Home Medications Prior to Admission medications   Medication Sig Start Date End Date Taking? Authorizing Provider  allopurinol (ZYLOPRIM) 100 MG tablet Take 100 mg by mouth daily with breakfast.     [provider]  ALPRAZolam (XANAX) 0.25 MG tablet Take 0.25 mg by mouth daily as needed for anxiety. 11/18/20   [provider]  amLODipine (NORVASC) 10 MG tablet Take 0.5 tablets (5 mg total) by mouth daily. Patient taking differently: Take 10 mg by mouth daily. 05/31/18   Kayleen Memos, DO  Cholecalciferol  (VITAMIN D PO) Take 1 tablet by mouth daily.    [provider]  Docusate Sodium 100 MG capsule Take 100 mg by mouth at bedtime.    [provider]  mirtazapine (REMERON) 15 MG tablet Take 15 mg by mouth at bedtime. 09/05/20   [provider]  oxyCODONE (OXY IR/ROXICODONE) 5 MG immediate release tablet Take 0.5-1 tablets (2.5-5 mg total) by mouth every 6 (six) hours as needed for severe pain. 12/09/20   Carlisle Cater, PA-C  pantoprazole (PROTONIX) 40 MG tablet Take 1 tablet (40 mg total) by mouth 2 (two) times daily. 05/31/18 07/30/18  Kayleen Memos, DO  tamsulosin (FLOMAX) 0.4 MG CAPS capsule Take 1 capsule (0.4 mg total) by mouth daily. 12/09/20   Carlisle Cater, PA-C  TURMERIC PO Take 1 tablet by mouth daily.    [provider]      Allergies    Patient has no known allergies.    Review of Systems   Review of Systems  Constitutional:  Negative for chills and fever.  HENT:  Positive for nosebleeds. Negative for facial swelling and trouble swallowing.   Eyes:  Negative for photophobia and visual disturbance.  Respiratory:  Negative for cough and shortness of breath.   Cardiovascular:  Negative for chest pain and palpitations.  Gastrointestinal:  Negative for abdominal pain, nausea and vomiting.  Endocrine: Negative for polydipsia and polyuria.  Genitourinary:  Negative for difficulty urinating and hematuria.  Musculoskeletal:  Negative for gait problem and joint swelling.  Skin:  Negative for pallor and rash.  Neurological:  Negative for syncope and headaches.  Psychiatric/Behavioral:  Negative for agitation and confusion.    Physical Exam Updated Vital Signs BP (!) 161/82 (BP Location: Right Arm)    Pulse 66    Temp 98.4 F (36.9 C)    Resp 16    Ht 5\' 11"  (1.803 m)    Wt 82.6 kg    SpO2 95%    BMI 25.38 kg/m  Physical Exam Vitals and nursing note reviewed.  Constitutional:      General: He is not in acute distress.    Appearance: He is  well-developed.  HENT:     Head: Normocephalic and atraumatic.     Right Ear: External ear normal.     Left Ear: External ear normal.     Nose: Nose normal.     Right Nostril: No epistaxis or septal hematoma.     Left Nostril: No epistaxis or septal hematoma.     Right Turbinates: Not enlarged.     Left Turbinates: Not enlarged.     Comments: No bleeding either nare appreciated, no blood to posterior oropharynx    Mouth/Throat:     Mouth: Mucous membranes are moist.  Eyes:     General: No scleral icterus. Cardiovascular:     Rate and Rhythm: Normal rate and regular rhythm.     Pulses: Normal pulses.     Heart sounds: Normal heart sounds.  Pulmonary:     Effort: Pulmonary effort is normal. No respiratory distress.     Breath sounds: Normal breath sounds.  Abdominal:     General: Abdomen is flat.     Palpations: Abdomen is soft.     Tenderness: There is no abdominal tenderness.  Musculoskeletal:        General: Normal range of motion.     Cervical back: Normal range of motion.     Right lower leg: No edema.     Left lower leg: No edema.  Skin:    General: Skin is warm and dry.     Capillary Refill: Capillary refill takes less than 2 seconds.  Neurological:     Mental Status: He is alert and oriented to person, place, and time.  Psychiatric:        Mood and Affect: Mood normal.        Behavior: Behavior normal.    ED Results / Procedures / Treatments   Labs (all labs ordered are listed, but only abnormal results are displayed) Labs Reviewed - No data to display  EKG None  Radiology No results found.  Procedures Procedures    Medications Ordered in ED Medications  oxymetazoline (AFRIN) 0.05 % nasal spray 2 spray (2 sprays Right Nare Given 10/15/21 2227)    ED Course/ Medical Decision Making/ A&P                           Medical Decision Making  CC: Nosebleed  This patient complains of above; this involves an extensive number of treatment options and is  a complaint that carries with it a high risk of complications and morbidity. Vital signs were reviewed. Serious etiologies considered.  Record review:   Previous records obtained and reviewed   Additional history obtained from spouse Work up as above, notable for:  Labs & imaging results that were  available during my care of the patient were reviewed by me and considered in my medical decision making.     Management: Afrin applied topically  Reassessment:  Patient has no recurrence of nosebleed.  He is breathing comfortably on room air.  No stridor, no drooling, no blood to posterior oropharynx.  No nausea.  No abdominal pain.  He feels roughly his baseline.  He is tolerant oral intake with difficulty.  He is ambulatory.  Hemodynamic stable.  Recommend using humidifier in the home and topical moisturizer to his nare.  Follow with PCP.  The patient improved significantly and was discharged in stable condition. Detailed discussions were had with the patient regarding current findings, and need for close f/u with PCP or on call doctor. The patient has been instructed to return immediately if the symptoms worsen in any way for re-evaluation. Patient verbalized understanding and is in agreement with current care plan. All questions answered prior to discharge.            This chart was dictated using voice recognition software.  Despite best efforts to proofread,  errors can occur which can change the documentation meaning.          Final Clinical Impression(s) / ED Diagnoses Final diagnoses:  Epistaxis    Rx / DC Orders ED Discharge Orders     None         Jeanell Sparrow, DO 10/15/21 2238

## 2021-10-20 DIAGNOSIS — R040 Epistaxis: Secondary | ICD-10-CM | POA: Diagnosis not present

## 2021-10-20 DIAGNOSIS — J019 Acute sinusitis, unspecified: Secondary | ICD-10-CM | POA: Diagnosis not present

## 2021-11-04 DIAGNOSIS — H25812 Combined forms of age-related cataract, left eye: Secondary | ICD-10-CM | POA: Diagnosis not present

## 2021-11-04 DIAGNOSIS — H268 Other specified cataract: Secondary | ICD-10-CM | POA: Diagnosis not present

## 2021-11-19 DIAGNOSIS — H9193 Unspecified hearing loss, bilateral: Secondary | ICD-10-CM | POA: Diagnosis not present

## 2021-11-19 DIAGNOSIS — I700 Atherosclerosis of aorta: Secondary | ICD-10-CM | POA: Diagnosis not present

## 2021-11-19 DIAGNOSIS — N1831 Chronic kidney disease, stage 3a: Secondary | ICD-10-CM | POA: Diagnosis not present

## 2021-11-19 DIAGNOSIS — H35033 Hypertensive retinopathy, bilateral: Secondary | ICD-10-CM | POA: Diagnosis not present

## 2021-11-19 DIAGNOSIS — M109 Gout, unspecified: Secondary | ICD-10-CM | POA: Diagnosis not present

## 2021-11-19 DIAGNOSIS — I10 Essential (primary) hypertension: Secondary | ICD-10-CM | POA: Diagnosis not present

## 2021-11-19 DIAGNOSIS — F419 Anxiety disorder, unspecified: Secondary | ICD-10-CM | POA: Diagnosis not present

## 2021-11-19 DIAGNOSIS — Z0000 Encounter for general adult medical examination without abnormal findings: Secondary | ICD-10-CM | POA: Diagnosis not present

## 2021-11-19 DIAGNOSIS — Z1389 Encounter for screening for other disorder: Secondary | ICD-10-CM | POA: Diagnosis not present

## 2022-03-09 DIAGNOSIS — H6121 Impacted cerumen, right ear: Secondary | ICD-10-CM | POA: Diagnosis not present

## 2022-03-19 DIAGNOSIS — R972 Elevated prostate specific antigen [PSA]: Secondary | ICD-10-CM | POA: Diagnosis not present

## 2022-04-09 DIAGNOSIS — R351 Nocturia: Secondary | ICD-10-CM | POA: Diagnosis not present

## 2022-04-09 DIAGNOSIS — N401 Enlarged prostate with lower urinary tract symptoms: Secondary | ICD-10-CM | POA: Diagnosis not present

## 2022-04-09 DIAGNOSIS — R972 Elevated prostate specific antigen [PSA]: Secondary | ICD-10-CM | POA: Diagnosis not present

## 2022-05-20 DIAGNOSIS — F419 Anxiety disorder, unspecified: Secondary | ICD-10-CM | POA: Diagnosis not present

## 2022-05-20 DIAGNOSIS — N1831 Chronic kidney disease, stage 3a: Secondary | ICD-10-CM | POA: Diagnosis not present

## 2022-05-20 DIAGNOSIS — I700 Atherosclerosis of aorta: Secondary | ICD-10-CM | POA: Diagnosis not present

## 2022-05-20 DIAGNOSIS — I10 Essential (primary) hypertension: Secondary | ICD-10-CM | POA: Diagnosis not present

## 2022-05-20 DIAGNOSIS — H9193 Unspecified hearing loss, bilateral: Secondary | ICD-10-CM | POA: Diagnosis not present

## 2022-05-20 DIAGNOSIS — G4700 Insomnia, unspecified: Secondary | ICD-10-CM | POA: Diagnosis not present

## 2022-06-17 DIAGNOSIS — H3581 Retinal edema: Secondary | ICD-10-CM | POA: Diagnosis not present

## 2022-06-17 DIAGNOSIS — H40013 Open angle with borderline findings, low risk, bilateral: Secondary | ICD-10-CM | POA: Diagnosis not present

## 2022-06-17 DIAGNOSIS — H524 Presbyopia: Secondary | ICD-10-CM | POA: Diagnosis not present

## 2022-06-17 DIAGNOSIS — Z961 Presence of intraocular lens: Secondary | ICD-10-CM | POA: Diagnosis not present

## 2022-06-17 DIAGNOSIS — H25811 Combined forms of age-related cataract, right eye: Secondary | ICD-10-CM | POA: Diagnosis not present

## 2022-06-25 ENCOUNTER — Encounter (INDEPENDENT_AMBULATORY_CARE_PROVIDER_SITE_OTHER): Payer: PPO | Admitting: Ophthalmology

## 2022-06-25 DIAGNOSIS — H59032 Cystoid macular edema following cataract surgery, left eye: Secondary | ICD-10-CM

## 2022-06-25 DIAGNOSIS — I10 Essential (primary) hypertension: Secondary | ICD-10-CM

## 2022-06-25 DIAGNOSIS — H33302 Unspecified retinal break, left eye: Secondary | ICD-10-CM | POA: Diagnosis not present

## 2022-06-25 DIAGNOSIS — H34832 Tributary (branch) retinal vein occlusion, left eye, with macular edema: Secondary | ICD-10-CM

## 2022-06-25 DIAGNOSIS — H43813 Vitreous degeneration, bilateral: Secondary | ICD-10-CM

## 2022-06-25 DIAGNOSIS — H35033 Hypertensive retinopathy, bilateral: Secondary | ICD-10-CM

## 2022-08-04 ENCOUNTER — Encounter (INDEPENDENT_AMBULATORY_CARE_PROVIDER_SITE_OTHER): Payer: PPO | Admitting: Ophthalmology

## 2022-08-04 DIAGNOSIS — H34832 Tributary (branch) retinal vein occlusion, left eye, with macular edema: Secondary | ICD-10-CM | POA: Diagnosis not present

## 2022-08-04 DIAGNOSIS — H35033 Hypertensive retinopathy, bilateral: Secondary | ICD-10-CM | POA: Diagnosis not present

## 2022-08-04 DIAGNOSIS — H43813 Vitreous degeneration, bilateral: Secondary | ICD-10-CM

## 2022-08-04 DIAGNOSIS — H59032 Cystoid macular edema following cataract surgery, left eye: Secondary | ICD-10-CM

## 2022-08-04 DIAGNOSIS — I10 Essential (primary) hypertension: Secondary | ICD-10-CM

## 2022-09-15 ENCOUNTER — Encounter (INDEPENDENT_AMBULATORY_CARE_PROVIDER_SITE_OTHER): Payer: PPO | Admitting: Ophthalmology

## 2022-09-15 DIAGNOSIS — H35033 Hypertensive retinopathy, bilateral: Secondary | ICD-10-CM

## 2022-09-15 DIAGNOSIS — H34832 Tributary (branch) retinal vein occlusion, left eye, with macular edema: Secondary | ICD-10-CM | POA: Diagnosis not present

## 2022-09-15 DIAGNOSIS — H33302 Unspecified retinal break, left eye: Secondary | ICD-10-CM

## 2022-09-15 DIAGNOSIS — H59032 Cystoid macular edema following cataract surgery, left eye: Secondary | ICD-10-CM

## 2022-09-15 DIAGNOSIS — H43813 Vitreous degeneration, bilateral: Secondary | ICD-10-CM | POA: Diagnosis not present

## 2022-09-15 DIAGNOSIS — I10 Essential (primary) hypertension: Secondary | ICD-10-CM

## 2022-09-23 DIAGNOSIS — G4700 Insomnia, unspecified: Secondary | ICD-10-CM | POA: Diagnosis not present

## 2022-09-23 DIAGNOSIS — N1831 Chronic kidney disease, stage 3a: Secondary | ICD-10-CM | POA: Diagnosis not present

## 2022-09-23 DIAGNOSIS — I10 Essential (primary) hypertension: Secondary | ICD-10-CM | POA: Diagnosis not present

## 2022-10-13 DIAGNOSIS — R972 Elevated prostate specific antigen [PSA]: Secondary | ICD-10-CM | POA: Diagnosis not present

## 2022-10-20 DIAGNOSIS — R972 Elevated prostate specific antigen [PSA]: Secondary | ICD-10-CM | POA: Diagnosis not present

## 2022-10-20 DIAGNOSIS — R351 Nocturia: Secondary | ICD-10-CM | POA: Diagnosis not present

## 2022-10-20 DIAGNOSIS — N401 Enlarged prostate with lower urinary tract symptoms: Secondary | ICD-10-CM | POA: Diagnosis not present

## 2022-10-27 ENCOUNTER — Encounter (INDEPENDENT_AMBULATORY_CARE_PROVIDER_SITE_OTHER): Payer: PPO | Admitting: Ophthalmology

## 2022-10-27 DIAGNOSIS — I10 Essential (primary) hypertension: Secondary | ICD-10-CM

## 2022-10-27 DIAGNOSIS — H59032 Cystoid macular edema following cataract surgery, left eye: Secondary | ICD-10-CM

## 2022-10-27 DIAGNOSIS — H35033 Hypertensive retinopathy, bilateral: Secondary | ICD-10-CM

## 2022-10-27 DIAGNOSIS — H33302 Unspecified retinal break, left eye: Secondary | ICD-10-CM

## 2022-10-27 DIAGNOSIS — H34832 Tributary (branch) retinal vein occlusion, left eye, with macular edema: Secondary | ICD-10-CM | POA: Diagnosis not present

## 2022-10-27 DIAGNOSIS — H43813 Vitreous degeneration, bilateral: Secondary | ICD-10-CM | POA: Diagnosis not present

## 2022-10-29 ENCOUNTER — Other Ambulatory Visit (HOSPITAL_COMMUNITY): Payer: Self-pay | Admitting: Urology

## 2022-10-29 DIAGNOSIS — R972 Elevated prostate specific antigen [PSA]: Secondary | ICD-10-CM

## 2022-11-11 ENCOUNTER — Encounter (HOSPITAL_COMMUNITY)
Admission: RE | Admit: 2022-11-11 | Discharge: 2022-11-11 | Disposition: A | Discharge status: Home / Self Care | Payer: PPO | Source: Ambulatory Visit | Attending: Urology | Admitting: Urology

## 2022-11-11 DIAGNOSIS — K5730 Diverticulosis of large intestine without perforation or abscess without bleeding: Secondary | ICD-10-CM | POA: Diagnosis not present

## 2022-11-11 DIAGNOSIS — N400 Enlarged prostate without lower urinary tract symptoms: Secondary | ICD-10-CM | POA: Diagnosis not present

## 2022-11-11 DIAGNOSIS — R972 Elevated prostate specific antigen [PSA]: Secondary | ICD-10-CM | POA: Insufficient documentation

## 2022-11-11 DIAGNOSIS — C7951 Secondary malignant neoplasm of bone: Secondary | ICD-10-CM | POA: Diagnosis not present

## 2022-11-11 MED ORDER — PIFLIFOLASTAT F 18 (PYLARIFY) INJECTION
9.0000 | Freq: Once | INTRAVENOUS | Status: AC
  Administered 2022-11-11: 9.9 via INTRAVENOUS

## 2022-11-18 DIAGNOSIS — I10 Essential (primary) hypertension: Secondary | ICD-10-CM | POA: Diagnosis not present

## 2022-11-18 DIAGNOSIS — N1831 Chronic kidney disease, stage 3a: Secondary | ICD-10-CM | POA: Diagnosis not present

## 2022-11-18 DIAGNOSIS — F5101 Primary insomnia: Secondary | ICD-10-CM | POA: Diagnosis not present

## 2022-11-18 DIAGNOSIS — I700 Atherosclerosis of aorta: Secondary | ICD-10-CM | POA: Diagnosis not present

## 2022-11-18 DIAGNOSIS — R972 Elevated prostate specific antigen [PSA]: Secondary | ICD-10-CM | POA: Diagnosis not present

## 2022-12-22 ENCOUNTER — Encounter (INDEPENDENT_AMBULATORY_CARE_PROVIDER_SITE_OTHER): Payer: PPO | Admitting: Ophthalmology

## 2022-12-22 DIAGNOSIS — H59032 Cystoid macular edema following cataract surgery, left eye: Secondary | ICD-10-CM | POA: Diagnosis not present

## 2022-12-22 DIAGNOSIS — I10 Essential (primary) hypertension: Secondary | ICD-10-CM | POA: Diagnosis not present

## 2022-12-22 DIAGNOSIS — H43813 Vitreous degeneration, bilateral: Secondary | ICD-10-CM

## 2022-12-22 DIAGNOSIS — H34832 Tributary (branch) retinal vein occlusion, left eye, with macular edema: Secondary | ICD-10-CM | POA: Diagnosis not present

## 2022-12-22 DIAGNOSIS — H33302 Unspecified retinal break, left eye: Secondary | ICD-10-CM | POA: Diagnosis not present

## 2022-12-22 DIAGNOSIS — H35033 Hypertensive retinopathy, bilateral: Secondary | ICD-10-CM | POA: Diagnosis not present

## 2022-12-22 DIAGNOSIS — H2511 Age-related nuclear cataract, right eye: Secondary | ICD-10-CM

## 2023-01-04 DIAGNOSIS — H40013 Open angle with borderline findings, low risk, bilateral: Secondary | ICD-10-CM | POA: Diagnosis not present

## 2023-01-04 DIAGNOSIS — H524 Presbyopia: Secondary | ICD-10-CM | POA: Diagnosis not present

## 2023-01-04 DIAGNOSIS — H3581 Retinal edema: Secondary | ICD-10-CM | POA: Diagnosis not present

## 2023-01-04 DIAGNOSIS — H25811 Combined forms of age-related cataract, right eye: Secondary | ICD-10-CM | POA: Diagnosis not present

## 2023-01-04 DIAGNOSIS — H35373 Puckering of macula, bilateral: Secondary | ICD-10-CM | POA: Diagnosis not present

## 2023-02-16 DIAGNOSIS — R972 Elevated prostate specific antigen [PSA]: Secondary | ICD-10-CM | POA: Diagnosis not present

## 2023-02-23 DIAGNOSIS — N401 Enlarged prostate with lower urinary tract symptoms: Secondary | ICD-10-CM | POA: Diagnosis not present

## 2023-02-23 DIAGNOSIS — R351 Nocturia: Secondary | ICD-10-CM | POA: Diagnosis not present

## 2023-02-23 DIAGNOSIS — R972 Elevated prostate specific antigen [PSA]: Secondary | ICD-10-CM | POA: Diagnosis not present

## 2023-03-02 ENCOUNTER — Encounter (INDEPENDENT_AMBULATORY_CARE_PROVIDER_SITE_OTHER): Payer: PPO | Admitting: Ophthalmology

## 2023-03-02 DIAGNOSIS — H34832 Tributary (branch) retinal vein occlusion, left eye, with macular edema: Secondary | ICD-10-CM | POA: Diagnosis not present

## 2023-03-02 DIAGNOSIS — H33302 Unspecified retinal break, left eye: Secondary | ICD-10-CM | POA: Diagnosis not present

## 2023-03-02 DIAGNOSIS — H35033 Hypertensive retinopathy, bilateral: Secondary | ICD-10-CM

## 2023-03-02 DIAGNOSIS — H59032 Cystoid macular edema following cataract surgery, left eye: Secondary | ICD-10-CM

## 2023-03-02 DIAGNOSIS — H43813 Vitreous degeneration, bilateral: Secondary | ICD-10-CM | POA: Diagnosis not present

## 2023-03-02 DIAGNOSIS — I10 Essential (primary) hypertension: Secondary | ICD-10-CM | POA: Diagnosis not present

## 2023-03-09 DIAGNOSIS — R972 Elevated prostate specific antigen [PSA]: Secondary | ICD-10-CM | POA: Diagnosis not present

## 2023-03-09 DIAGNOSIS — F419 Anxiety disorder, unspecified: Secondary | ICD-10-CM | POA: Diagnosis not present

## 2023-03-09 DIAGNOSIS — R42 Dizziness and giddiness: Secondary | ICD-10-CM | POA: Diagnosis not present

## 2023-04-11 DIAGNOSIS — H259 Unspecified age-related cataract: Secondary | ICD-10-CM | POA: Diagnosis not present

## 2023-04-11 DIAGNOSIS — I129 Hypertensive chronic kidney disease with stage 1 through stage 4 chronic kidney disease, or unspecified chronic kidney disease: Secondary | ICD-10-CM | POA: Diagnosis not present

## 2023-04-11 DIAGNOSIS — N183 Chronic kidney disease, stage 3 unspecified: Secondary | ICD-10-CM | POA: Diagnosis not present

## 2023-04-11 DIAGNOSIS — F419 Anxiety disorder, unspecified: Secondary | ICD-10-CM | POA: Diagnosis not present

## 2023-04-11 DIAGNOSIS — I2510 Atherosclerotic heart disease of native coronary artery without angina pectoris: Secondary | ICD-10-CM | POA: Diagnosis not present

## 2023-04-11 DIAGNOSIS — H4010X0 Unspecified open-angle glaucoma, stage unspecified: Secondary | ICD-10-CM | POA: Diagnosis not present

## 2023-04-11 DIAGNOSIS — F411 Generalized anxiety disorder: Secondary | ICD-10-CM | POA: Diagnosis not present

## 2023-04-11 DIAGNOSIS — I771 Stricture of artery: Secondary | ICD-10-CM | POA: Diagnosis not present

## 2023-04-11 DIAGNOSIS — I10 Essential (primary) hypertension: Secondary | ICD-10-CM | POA: Diagnosis not present

## 2023-04-11 DIAGNOSIS — G8929 Other chronic pain: Secondary | ICD-10-CM | POA: Diagnosis not present

## 2023-04-11 DIAGNOSIS — I700 Atherosclerosis of aorta: Secondary | ICD-10-CM | POA: Diagnosis not present

## 2023-04-11 DIAGNOSIS — E663 Overweight: Secondary | ICD-10-CM | POA: Diagnosis not present

## 2023-04-21 DIAGNOSIS — C61 Malignant neoplasm of prostate: Secondary | ICD-10-CM | POA: Diagnosis not present

## 2023-04-21 DIAGNOSIS — Z0000 Encounter for general adult medical examination without abnormal findings: Secondary | ICD-10-CM | POA: Diagnosis not present

## 2023-04-21 DIAGNOSIS — M109 Gout, unspecified: Secondary | ICD-10-CM | POA: Diagnosis not present

## 2023-04-21 DIAGNOSIS — F411 Generalized anxiety disorder: Secondary | ICD-10-CM | POA: Diagnosis not present

## 2023-04-21 DIAGNOSIS — H35033 Hypertensive retinopathy, bilateral: Secondary | ICD-10-CM | POA: Diagnosis not present

## 2023-04-21 DIAGNOSIS — I700 Atherosclerosis of aorta: Secondary | ICD-10-CM | POA: Diagnosis not present

## 2023-04-21 DIAGNOSIS — F5101 Primary insomnia: Secondary | ICD-10-CM | POA: Diagnosis not present

## 2023-04-21 DIAGNOSIS — H579 Unspecified disorder of eye and adnexa: Secondary | ICD-10-CM | POA: Diagnosis not present

## 2023-04-21 DIAGNOSIS — N1831 Chronic kidney disease, stage 3a: Secondary | ICD-10-CM | POA: Diagnosis not present

## 2023-04-21 DIAGNOSIS — Z1331 Encounter for screening for depression: Secondary | ICD-10-CM | POA: Diagnosis not present

## 2023-04-21 DIAGNOSIS — I10 Essential (primary) hypertension: Secondary | ICD-10-CM | POA: Diagnosis not present

## 2023-05-25 ENCOUNTER — Encounter (INDEPENDENT_AMBULATORY_CARE_PROVIDER_SITE_OTHER): Payer: PPO | Admitting: Ophthalmology

## 2023-05-25 DIAGNOSIS — H34832 Tributary (branch) retinal vein occlusion, left eye, with macular edema: Secondary | ICD-10-CM | POA: Diagnosis not present

## 2023-05-25 DIAGNOSIS — I10 Essential (primary) hypertension: Secondary | ICD-10-CM

## 2023-05-25 DIAGNOSIS — H59032 Cystoid macular edema following cataract surgery, left eye: Secondary | ICD-10-CM | POA: Diagnosis not present

## 2023-05-25 DIAGNOSIS — H43813 Vitreous degeneration, bilateral: Secondary | ICD-10-CM | POA: Diagnosis not present

## 2023-05-25 DIAGNOSIS — H33302 Unspecified retinal break, left eye: Secondary | ICD-10-CM | POA: Diagnosis not present

## 2023-05-25 DIAGNOSIS — H35033 Hypertensive retinopathy, bilateral: Secondary | ICD-10-CM

## 2023-08-16 DIAGNOSIS — R972 Elevated prostate specific antigen [PSA]: Secondary | ICD-10-CM | POA: Diagnosis not present

## 2023-08-17 ENCOUNTER — Encounter (INDEPENDENT_AMBULATORY_CARE_PROVIDER_SITE_OTHER): Payer: PPO | Admitting: Ophthalmology

## 2023-08-17 DIAGNOSIS — H35033 Hypertensive retinopathy, bilateral: Secondary | ICD-10-CM

## 2023-08-17 DIAGNOSIS — H43813 Vitreous degeneration, bilateral: Secondary | ICD-10-CM

## 2023-08-17 DIAGNOSIS — H34832 Tributary (branch) retinal vein occlusion, left eye, with macular edema: Secondary | ICD-10-CM | POA: Diagnosis not present

## 2023-08-17 DIAGNOSIS — I10 Essential (primary) hypertension: Secondary | ICD-10-CM | POA: Diagnosis not present

## 2023-08-17 DIAGNOSIS — H33302 Unspecified retinal break, left eye: Secondary | ICD-10-CM

## 2023-08-23 DIAGNOSIS — R351 Nocturia: Secondary | ICD-10-CM | POA: Diagnosis not present

## 2023-08-23 DIAGNOSIS — N401 Enlarged prostate with lower urinary tract symptoms: Secondary | ICD-10-CM | POA: Diagnosis not present

## 2023-08-23 DIAGNOSIS — R972 Elevated prostate specific antigen [PSA]: Secondary | ICD-10-CM | POA: Diagnosis not present

## 2023-09-14 ENCOUNTER — Encounter (INDEPENDENT_AMBULATORY_CARE_PROVIDER_SITE_OTHER): Payer: PPO | Admitting: Ophthalmology

## 2023-09-14 DIAGNOSIS — I10 Essential (primary) hypertension: Secondary | ICD-10-CM | POA: Diagnosis not present

## 2023-09-14 DIAGNOSIS — H35033 Hypertensive retinopathy, bilateral: Secondary | ICD-10-CM

## 2023-09-14 DIAGNOSIS — H34832 Tributary (branch) retinal vein occlusion, left eye, with macular edema: Secondary | ICD-10-CM

## 2023-09-14 DIAGNOSIS — H33302 Unspecified retinal break, left eye: Secondary | ICD-10-CM | POA: Diagnosis not present

## 2023-09-14 DIAGNOSIS — H43813 Vitreous degeneration, bilateral: Secondary | ICD-10-CM | POA: Diagnosis not present

## 2023-10-12 ENCOUNTER — Encounter (INDEPENDENT_AMBULATORY_CARE_PROVIDER_SITE_OTHER): Payer: PPO | Admitting: Ophthalmology

## 2023-10-12 DIAGNOSIS — H34832 Tributary (branch) retinal vein occlusion, left eye, with macular edema: Secondary | ICD-10-CM

## 2023-10-12 DIAGNOSIS — H33302 Unspecified retinal break, left eye: Secondary | ICD-10-CM

## 2023-10-12 DIAGNOSIS — H43813 Vitreous degeneration, bilateral: Secondary | ICD-10-CM | POA: Diagnosis not present

## 2023-10-12 DIAGNOSIS — I10 Essential (primary) hypertension: Secondary | ICD-10-CM | POA: Diagnosis not present

## 2023-10-12 DIAGNOSIS — H35033 Hypertensive retinopathy, bilateral: Secondary | ICD-10-CM | POA: Diagnosis not present

## 2023-11-16 ENCOUNTER — Encounter (INDEPENDENT_AMBULATORY_CARE_PROVIDER_SITE_OTHER): Payer: PPO | Admitting: Ophthalmology

## 2023-11-16 DIAGNOSIS — H43813 Vitreous degeneration, bilateral: Secondary | ICD-10-CM

## 2023-11-16 DIAGNOSIS — H34832 Tributary (branch) retinal vein occlusion, left eye, with macular edema: Secondary | ICD-10-CM

## 2023-11-16 DIAGNOSIS — H35033 Hypertensive retinopathy, bilateral: Secondary | ICD-10-CM | POA: Diagnosis not present

## 2023-11-16 DIAGNOSIS — H33302 Unspecified retinal break, left eye: Secondary | ICD-10-CM | POA: Diagnosis not present

## 2023-11-16 DIAGNOSIS — I10 Essential (primary) hypertension: Secondary | ICD-10-CM

## 2023-12-20 ENCOUNTER — Encounter (INDEPENDENT_AMBULATORY_CARE_PROVIDER_SITE_OTHER): Payer: PPO | Admitting: Ophthalmology

## 2023-12-20 DIAGNOSIS — H34832 Tributary (branch) retinal vein occlusion, left eye, with macular edema: Secondary | ICD-10-CM

## 2023-12-20 DIAGNOSIS — I10 Essential (primary) hypertension: Secondary | ICD-10-CM | POA: Diagnosis not present

## 2023-12-20 DIAGNOSIS — H43813 Vitreous degeneration, bilateral: Secondary | ICD-10-CM

## 2023-12-20 DIAGNOSIS — H35033 Hypertensive retinopathy, bilateral: Secondary | ICD-10-CM

## 2023-12-20 DIAGNOSIS — H33302 Unspecified retinal break, left eye: Secondary | ICD-10-CM

## 2024-01-05 DIAGNOSIS — R059 Cough, unspecified: Secondary | ICD-10-CM | POA: Diagnosis not present

## 2024-01-05 DIAGNOSIS — J069 Acute upper respiratory infection, unspecified: Secondary | ICD-10-CM | POA: Diagnosis not present

## 2024-01-31 ENCOUNTER — Encounter (INDEPENDENT_AMBULATORY_CARE_PROVIDER_SITE_OTHER): Admitting: Ophthalmology

## 2024-01-31 DIAGNOSIS — H34832 Tributary (branch) retinal vein occlusion, left eye, with macular edema: Secondary | ICD-10-CM

## 2024-01-31 DIAGNOSIS — H35033 Hypertensive retinopathy, bilateral: Secondary | ICD-10-CM | POA: Diagnosis not present

## 2024-01-31 DIAGNOSIS — H33302 Unspecified retinal break, left eye: Secondary | ICD-10-CM

## 2024-01-31 DIAGNOSIS — H43813 Vitreous degeneration, bilateral: Secondary | ICD-10-CM | POA: Diagnosis not present

## 2024-01-31 DIAGNOSIS — I10 Essential (primary) hypertension: Secondary | ICD-10-CM | POA: Diagnosis not present

## 2024-02-13 DIAGNOSIS — R972 Elevated prostate specific antigen [PSA]: Secondary | ICD-10-CM | POA: Diagnosis not present

## 2024-02-20 DIAGNOSIS — R351 Nocturia: Secondary | ICD-10-CM | POA: Diagnosis not present

## 2024-02-20 DIAGNOSIS — N401 Enlarged prostate with lower urinary tract symptoms: Secondary | ICD-10-CM | POA: Diagnosis not present

## 2024-02-20 DIAGNOSIS — C61 Malignant neoplasm of prostate: Secondary | ICD-10-CM | POA: Diagnosis not present

## 2024-03-02 DIAGNOSIS — H35373 Puckering of macula, bilateral: Secondary | ICD-10-CM | POA: Diagnosis not present

## 2024-03-02 DIAGNOSIS — H40013 Open angle with borderline findings, low risk, bilateral: Secondary | ICD-10-CM | POA: Diagnosis not present

## 2024-03-02 DIAGNOSIS — H524 Presbyopia: Secondary | ICD-10-CM | POA: Diagnosis not present

## 2024-03-02 DIAGNOSIS — H25811 Combined forms of age-related cataract, right eye: Secondary | ICD-10-CM | POA: Diagnosis not present

## 2024-03-02 DIAGNOSIS — H3581 Retinal edema: Secondary | ICD-10-CM | POA: Diagnosis not present

## 2024-03-20 ENCOUNTER — Encounter (INDEPENDENT_AMBULATORY_CARE_PROVIDER_SITE_OTHER): Admitting: Ophthalmology

## 2024-03-22 ENCOUNTER — Encounter (INDEPENDENT_AMBULATORY_CARE_PROVIDER_SITE_OTHER): Admitting: Ophthalmology

## 2024-03-22 DIAGNOSIS — H34832 Tributary (branch) retinal vein occlusion, left eye, with macular edema: Secondary | ICD-10-CM | POA: Diagnosis not present

## 2024-03-22 DIAGNOSIS — H35033 Hypertensive retinopathy, bilateral: Secondary | ICD-10-CM | POA: Diagnosis not present

## 2024-03-22 DIAGNOSIS — I10 Essential (primary) hypertension: Secondary | ICD-10-CM

## 2024-03-22 DIAGNOSIS — H43813 Vitreous degeneration, bilateral: Secondary | ICD-10-CM

## 2024-03-22 DIAGNOSIS — H33302 Unspecified retinal break, left eye: Secondary | ICD-10-CM

## 2024-05-10 ENCOUNTER — Encounter (INDEPENDENT_AMBULATORY_CARE_PROVIDER_SITE_OTHER): Admitting: Ophthalmology

## 2024-05-10 DIAGNOSIS — H43813 Vitreous degeneration, bilateral: Secondary | ICD-10-CM

## 2024-05-10 DIAGNOSIS — I10 Essential (primary) hypertension: Secondary | ICD-10-CM | POA: Diagnosis not present

## 2024-05-10 DIAGNOSIS — H35033 Hypertensive retinopathy, bilateral: Secondary | ICD-10-CM

## 2024-05-10 DIAGNOSIS — H2511 Age-related nuclear cataract, right eye: Secondary | ICD-10-CM | POA: Diagnosis not present

## 2024-05-10 DIAGNOSIS — H34832 Tributary (branch) retinal vein occlusion, left eye, with macular edema: Secondary | ICD-10-CM | POA: Diagnosis not present

## 2024-06-18 DIAGNOSIS — F419 Anxiety disorder, unspecified: Secondary | ICD-10-CM | POA: Diagnosis not present

## 2024-06-18 DIAGNOSIS — Z0000 Encounter for general adult medical examination without abnormal findings: Secondary | ICD-10-CM | POA: Diagnosis not present

## 2024-06-18 DIAGNOSIS — N1831 Chronic kidney disease, stage 3a: Secondary | ICD-10-CM | POA: Diagnosis not present

## 2024-06-18 DIAGNOSIS — R0981 Nasal congestion: Secondary | ICD-10-CM | POA: Diagnosis not present

## 2024-06-18 DIAGNOSIS — Z1331 Encounter for screening for depression: Secondary | ICD-10-CM | POA: Diagnosis not present

## 2024-06-18 DIAGNOSIS — C61 Malignant neoplasm of prostate: Secondary | ICD-10-CM | POA: Diagnosis not present

## 2024-06-18 DIAGNOSIS — M109 Gout, unspecified: Secondary | ICD-10-CM | POA: Diagnosis not present

## 2024-06-18 DIAGNOSIS — I700 Atherosclerosis of aorta: Secondary | ICD-10-CM | POA: Diagnosis not present

## 2024-06-18 DIAGNOSIS — I10 Essential (primary) hypertension: Secondary | ICD-10-CM | POA: Diagnosis not present

## 2024-06-18 DIAGNOSIS — Z23 Encounter for immunization: Secondary | ICD-10-CM | POA: Diagnosis not present

## 2024-07-05 ENCOUNTER — Encounter (INDEPENDENT_AMBULATORY_CARE_PROVIDER_SITE_OTHER): Admitting: Ophthalmology

## 2024-07-05 DIAGNOSIS — H43813 Vitreous degeneration, bilateral: Secondary | ICD-10-CM | POA: Diagnosis not present

## 2024-07-05 DIAGNOSIS — H35033 Hypertensive retinopathy, bilateral: Secondary | ICD-10-CM

## 2024-07-05 DIAGNOSIS — I10 Essential (primary) hypertension: Secondary | ICD-10-CM | POA: Diagnosis not present

## 2024-07-05 DIAGNOSIS — H34832 Tributary (branch) retinal vein occlusion, left eye, with macular edema: Secondary | ICD-10-CM

## 2024-08-15 DIAGNOSIS — C61 Malignant neoplasm of prostate: Secondary | ICD-10-CM | POA: Diagnosis not present

## 2024-08-21 DIAGNOSIS — N401 Enlarged prostate with lower urinary tract symptoms: Secondary | ICD-10-CM | POA: Diagnosis not present

## 2024-08-21 DIAGNOSIS — Z87442 Personal history of urinary calculi: Secondary | ICD-10-CM | POA: Diagnosis not present

## 2024-08-21 DIAGNOSIS — R351 Nocturia: Secondary | ICD-10-CM | POA: Diagnosis not present

## 2024-08-21 DIAGNOSIS — C61 Malignant neoplasm of prostate: Secondary | ICD-10-CM | POA: Diagnosis not present

## 2024-09-06 ENCOUNTER — Encounter (INDEPENDENT_AMBULATORY_CARE_PROVIDER_SITE_OTHER): Admitting: Ophthalmology

## 2024-09-06 DIAGNOSIS — H34832 Tributary (branch) retinal vein occlusion, left eye, with macular edema: Secondary | ICD-10-CM

## 2024-09-06 DIAGNOSIS — H33302 Unspecified retinal break, left eye: Secondary | ICD-10-CM | POA: Diagnosis not present

## 2024-09-06 DIAGNOSIS — H43813 Vitreous degeneration, bilateral: Secondary | ICD-10-CM

## 2024-09-06 DIAGNOSIS — H35033 Hypertensive retinopathy, bilateral: Secondary | ICD-10-CM | POA: Diagnosis not present

## 2024-09-06 DIAGNOSIS — I10 Essential (primary) hypertension: Secondary | ICD-10-CM

## 2024-11-08 ENCOUNTER — Encounter (INDEPENDENT_AMBULATORY_CARE_PROVIDER_SITE_OTHER): Admitting: Ophthalmology

## 2024-11-08 DIAGNOSIS — H35033 Hypertensive retinopathy, bilateral: Secondary | ICD-10-CM

## 2024-11-08 DIAGNOSIS — H43813 Vitreous degeneration, bilateral: Secondary | ICD-10-CM

## 2024-11-08 DIAGNOSIS — H34832 Tributary (branch) retinal vein occlusion, left eye, with macular edema: Secondary | ICD-10-CM

## 2024-11-08 DIAGNOSIS — I10 Essential (primary) hypertension: Secondary | ICD-10-CM

## 2025-01-31 ENCOUNTER — Encounter (INDEPENDENT_AMBULATORY_CARE_PROVIDER_SITE_OTHER): Admitting: Ophthalmology
